# Patient Record
Sex: Male | Born: 1985 | Race: White | Hispanic: No | Smoking: Current every day smoker
Health system: Southern US, Community
[De-identification: ages and names within clinical notes are randomized; demographics above are authoritative.]

## PROBLEM LIST (undated history)

## (undated) DIAGNOSIS — F319 Bipolar disorder, unspecified: Secondary | ICD-10-CM

## (undated) DIAGNOSIS — F419 Anxiety disorder, unspecified: Secondary | ICD-10-CM

## (undated) DIAGNOSIS — S069X9A Unspecified intracranial injury with loss of consciousness of unspecified duration, initial encounter: Secondary | ICD-10-CM

## (undated) DIAGNOSIS — S069XAA Unspecified intracranial injury with loss of consciousness status unknown, initial encounter: Secondary | ICD-10-CM

## (undated) DIAGNOSIS — K5792 Diverticulitis of intestine, part unspecified, without perforation or abscess without bleeding: Secondary | ICD-10-CM

## (undated) HISTORY — PX: FRACTURE SURGERY: SHX138

## (undated) HISTORY — PX: ANKLE SURGERY: SHX546

---
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
## 2008-02-10 ENCOUNTER — Emergency Department (HOSPITAL_COMMUNITY): Admission: EM | Admit: 2008-02-10 | Discharge: 2008-02-10 | Payer: Self-pay | Admitting: Emergency Medicine

## 2008-08-09 ENCOUNTER — Emergency Department (HOSPITAL_COMMUNITY): Admission: EM | Admit: 2008-08-09 | Discharge: 2008-08-09 | Payer: Self-pay | Admitting: Emergency Medicine

## 2009-10-18 ENCOUNTER — Emergency Department (HOSPITAL_COMMUNITY): Admission: EM | Admit: 2009-10-18 | Discharge: 2009-10-20 | Payer: Self-pay | Admitting: Emergency Medicine

## 2010-05-24 LAB — DIFFERENTIAL
Basophils Absolute: 0 10*3/uL (ref 0.0–0.1)
Basophils Relative: 1 % (ref 0–1)
Eosinophils Relative: 1 % (ref 0–5)
Lymphocytes Relative: 25 % (ref 12–46)
Monocytes Absolute: 1 10*3/uL (ref 0.1–1.0)
Monocytes Relative: 18 % — ABNORMAL HIGH (ref 3–12)
Neutro Abs: 3.3 10*3/uL (ref 1.7–7.7)

## 2010-05-24 LAB — CBC
HCT: 42.4 % (ref 39.0–52.0)
Hemoglobin: 14.4 g/dL (ref 13.0–17.0)
MCHC: 34 g/dL (ref 30.0–36.0)
MCV: 92.5 fL (ref 78.0–100.0)
RDW: 14.4 % (ref 11.5–15.5)

## 2010-05-24 LAB — RAPID URINE DRUG SCREEN, HOSP PERFORMED
Amphetamines: NOT DETECTED
Cocaine: NOT DETECTED
Opiates: NOT DETECTED
Tetrahydrocannabinol: NOT DETECTED

## 2010-05-24 LAB — BASIC METABOLIC PANEL
BUN: 16 mg/dL (ref 6–23)
CO2: 29 mEq/L (ref 19–32)
Calcium: 9 mg/dL (ref 8.4–10.5)
Chloride: 105 mEq/L (ref 96–112)
Creatinine, Ser: 0.94 mg/dL (ref 0.4–1.5)
GFR calc Af Amer: >60 mL/min (ref >60)
GFR calc non Af Amer: >60 mL/min (ref >60)
Glucose, Bld: 99 mg/dL (ref 70–99)
Potassium: 3.5 mEq/L (ref 3.5–5.1)
Sodium: 141 mEq/L (ref 135–145)

## 2010-05-24 LAB — ETHANOL: Alcohol, Ethyl (B): <5 mg/dL (ref 0–10)

## 2010-06-17 LAB — URINALYSIS, ROUTINE W REFLEX MICROSCOPIC
Bilirubin Urine: NEGATIVE
Ketones, ur: NEGATIVE mg/dL
Nitrite: NEGATIVE
Urobilinogen, UA: 0.2 mg/dL (ref 0.0–1.0)
pH: 6 (ref 5.0–8.0)

## 2010-06-17 LAB — ETHANOL: Alcohol, Ethyl (B): <5 mg/dL (ref 0–10)

## 2010-06-17 LAB — BASIC METABOLIC PANEL
Chloride: 103 mEq/L (ref 96–112)
GFR calc Af Amer: >60 mL/min (ref >60)
GFR calc non Af Amer: >60 mL/min (ref >60)
Potassium: 3.4 mEq/L — ABNORMAL LOW (ref 3.5–5.1)
Sodium: 139 mEq/L (ref 135–145)

## 2010-06-17 LAB — DIFFERENTIAL
Eosinophils Relative: 0 % (ref 0–5)
Lymphocytes Relative: 15 % (ref 12–46)
Lymphs Abs: 1.1 10*3/uL (ref 0.7–4.0)
Monocytes Absolute: 0.9 10*3/uL (ref 0.1–1.0)
Monocytes Relative: 13 % — ABNORMAL HIGH (ref 3–12)

## 2010-06-17 LAB — RAPID URINE DRUG SCREEN, HOSP PERFORMED: Tetrahydrocannabinol: POSITIVE — AB

## 2010-06-17 LAB — CBC
HCT: 47 % (ref 39.0–52.0)
Hemoglobin: 16.6 g/dL (ref 13.0–17.0)
MCV: 93 fL (ref 78.0–100.0)
Platelets: 222 10*3/uL (ref 150–400)
RBC: 5.05 MIL/uL (ref 4.22–5.81)
WBC: 7.1 10*3/uL (ref 4.0–10.5)

## 2010-07-26 NOTE — H&P (Signed)
[name], [name]            ACCOUNT NO.:  [id number]   MEDICAL RECORD NO.:  [id number]          PATIENT TYPE:  AMB   LOCATION:  DAY                           FACILITY:  [facility]   PHYSICIAN:  Vickki Hearing, M.D.DATE OF BIRTH:  Jun 11, 1985   DATE OF ADMISSION:  DATE OF DISCHARGE:  LH                              HISTORY & PHYSICAL   CHIEF COMPLAINT:  Pain, left ankle.   HISTORY:  This is an 25 year old male who is in college at [employer] in [place].  He was injured on April 26, 2006,  falling, injuring his left ankle, sustained a left fibular fracture.  Initial treatment in the emergency room at [facility], splint was  applied.  Radiographs show a fibular fracture.  He was put on crutches.  He continued to go to school, so his foot is significantly swollen.  We  have given him instructions to go home, ice and elevate the foot and  have surgery when the foot swelling is resolved.   REVIEW OF SYSTEMS:  He is a healthy male, complains of some poor vision,  joint pain related to the injury.  All eight systems reviewed, otherwise  normal.   PAST HISTORY:  No allergies.   No medical problems.   No surgery.   No medications.   FAMILY HISTORY:  Positive for cancer and heart disease.   SOCIAL HISTORY:  He is a single, Physicist, medical.  Smokes a pack a  week.  Drinks a case of beer a week.  He is in his first year of  college.   PHYSICAL EXAMINATION:  Weight 180, pulse 86, respiratory rate 18.  Appearance normal.  He is tall and muscular with ectomorphic body  habitus.  PERIPHERAL VASCULAR SYSTEM:  Excellent pulses and perfusion.  Swelling  related to the left ankle fracture.  Gait is remarkable for non-weightbearing, crutch ambulation.  Foot swelling again is noted.  There is bruising and ecchymosis around  the circumference of the ankle. His Achilles tendon is palpably intact.  He has painful dorsiflexion of the foot and holds it in plantar  flexion.  Stability testing could not be performed.  The remaining extremities are aligned properly with no contracture.  No  subluxation, atrophy or tremor.  SKIN:  Normal everywhere else except the left ankle; again ecchymosis,  no blisters.  NEUROLOGICAL:  Tests showed normal coordination and reflexes.  Sensation  was normal in the foot, ankle and leg.  He was oriented x3.  Compartments were soft.  His mood and affect were normal.   A repeat mortise view was taken in the office due to the poor quality of  the initial film done in the ER.  It shows medial clear space widening  and displacement of the fibular fracture.  This will require surgical  treatment of the left ankle with plate and screws.   Pending evaluation on April 30, 2006, the patient will undergo open  treatment and internal fixation of the left ankle for left ankle fibular  fracture.   Informed consent process completed.  Risks and benefits explained.  All  questions answered.      Vickki Hearing, M.D.  Electronically Signed     SEH/MEDQ  D:  04/30/2006  T:  04/30/2006  Job:  [id number]   cc:   [facility]  Fax: [phone]

## 2010-07-26 NOTE — Discharge Summary (Signed)
NAMEBARRETT, GOLDIE            ACCOUNT NO.:  [id number]   MEDICAL RECORD NO.:  [id number]          PATIENT TYPE:  OBV   LOCATION:  A321                          FACILITY:  [facility]   PHYSICIAN:  Vickki Hearing, M.D.DATE OF BIRTH:  25-Oct-1985   DATE OF ADMISSION:  05/01/2006  DATE OF DISCHARGE:  02/23/2008LH                               DISCHARGE SUMMARY   Admitted for internal fixation of the left ankle after surgery.  He had  pain uncontrolled after 150 mcg of Fentanyl and 800 mg of Motrin, 500 mg  of Tylenol, Percocet, and morphine as well as 60 mg of Toradol.   Therefore, he was admitted for observation.   HOSPITAL COURSE:  He was put on a PCA pump, oral Percocet, and his pain  decreased to a level of 2 by May 02, 2006.  He was able to ambulate  with crutches nonweightbearing.  He caught up with physical therapy and  tolerated that well.   Foot had minimal swelling and vital signs were stable.   DISCHARGE MEDICATIONS:  1. Phenergan 25 mg p.o. q.6 hours p.r.n. for nausea.  2. Percocet 5 mg p.o. q.4 hours p.r.n. for pain, enough to last 2      weeks.  3. Ibuprofen 800 mg q.8 hours over-the-counter.   He is to follow up with me on Monday.  He is to continue  nonweightbearing and keep his foot iced and elevated.      Vickki Hearing, M.D.  Electronically Signed     SEH/MEDQ  D:  05/02/2006  T:  05/02/2006  Job:  [id number]

## 2010-07-26 NOTE — Op Note (Signed)
[name], [name]            ACCOUNT NO.:  [id number]   MEDICAL RECORD NO.:  [id number]          PATIENT TYPE:  AMB   LOCATION:  DAY                           FACILITY:  [facility]   PHYSICIAN:  Vickki Hearing, M.D.DATE OF BIRTH:  1985/11/27   DATE OF PROCEDURE:  05/01/2006  DATE OF DISCHARGE:                               OPERATIVE REPORT   This is an 25 year old male who is in collage at [employer] in  [place].  He was injured on April 26, 2006 secondary to a fall.  He sustained a left fibular fracture.  He was treated at [facility]  emergency room.  A splint was applied.  Radiographs showed a fibular  fracture.  He was put on crutches.  He continued to go to school.  He  came to the office with a significantly swollen foot, preempting  surgical treatment at that time.  He was given instructions to go home,  ice, elevate and a compression wrap was made.  He came back in two days  later for recheck of his foot.  His skin wrinkled, and we prepared him  for surgery for today.   PREOPERATIVE DIAGNOSIS:  Weber B left fibula fracture with medial  deltoid ligament sprain.   POSTOPERATIVE DIAGNOSIS:  Weber B left fibula fracture with medial  deltoid ligament sprain.   PROCEDURE:  Open treatment and internal fixation of the left fibula with  interfragmentary screw fixation and buttress plating from the [employer]  small fragment set.   SURGEON:  Vickki Hearing, M.D.   ASSISTANT:  None.   ANESTHESIA:  General by LMA.   OPERATIVE FINDINGS:  The fibular fracture was oblique with a long  posterior spike.  The ligaments between the fibula and the tibia were  intact.  After fixation, external rotation stress showed no syndesmotic  injury.   DESCRIPTION OF PROCEDURE:  This 25 year old male marked his left ankle  as the surgical site, and I countersigned it.  His history and physical  was updated, and he was taken to the operating room for general  anesthetic.   Antibiotics were then started.  At that point his left foot  and ankle were prepped and draped using DuraPrep and sterile technique.  A timeout procedure was then taken.  The surgical site was confirmed.  Antibiotics were confirmed.  The foot was elevated and exsanguinated  with a 4-inch Esmarch.  The tourniquet was elevated to 300 mmHg where it  stayed for 58 minutes.   A straight incision was made over the fibula and taken down to bone  distally and bluntly dissected down to bone proximally.  The fracture  site was irrigated, curetted and manually reduced and held with bone  clamps.  Radiograph was obtained, showing intact and restored mortise.   We then took a 3-5 drill bit and drilled the proximal fragment followed  with a countersink golf tee 2-5 drill bit depth gauge and put an  interfragmentary screw which gave excellent compression and reduction of  the fracture site.  We then applied a lateral seven-hole buttress plate  using six of the  seven holes using AO technique.  Each hole was drilled,  measured and screw was applied.  All screws were tightened at the end of  the procedure.  AP mortise and lateral x-rays showed confirmation of the  reduction.  At that point the syndesmosis was checked by external  rotation and manual traction with a dental pick.   Wound was irrigated and closed with 2-0 Monocryl and a combination of 3-  0 nylon and staples.  The tourniquet was released and a  short leg cast  was applied over sterile dressings.  The foot was left in neutral  position in terms of its relative position to the leg.   The patient was extubated and taken to the recovery room in stable  condition.   Plan is for nonweightbearing for 6 weeks and then weightbearing in a  cast for some time between 4-6 weeks.  We may use a articulated ankle  brace at the 6-week point.      Vickki Hearing, M.D.  Electronically Signed     SEH/MEDQ  D:  05/01/2006  T:  05/01/2006  Job:   [id number]

## 2011-02-08 ENCOUNTER — Encounter: Payer: Self-pay | Admitting: *Deleted

## 2011-02-08 ENCOUNTER — Emergency Department (HOSPITAL_COMMUNITY)
Admission: EM | Admit: 2011-02-08 | Discharge: 2011-02-08 | Disposition: A | Discharge status: Home / Self Care | Payer: BC Managed Care – PPO | Attending: Emergency Medicine | Admitting: Emergency Medicine

## 2011-02-08 ENCOUNTER — Emergency Department (HOSPITAL_COMMUNITY): Payer: BC Managed Care – PPO

## 2011-02-08 DIAGNOSIS — S20229A Contusion of unspecified back wall of thorax, initial encounter: Secondary | ICD-10-CM | POA: Insufficient documentation

## 2011-02-08 DIAGNOSIS — W108XXA Fall (on) (from) other stairs and steps, initial encounter: Secondary | ICD-10-CM | POA: Insufficient documentation

## 2011-02-08 DIAGNOSIS — F17200 Nicotine dependence, unspecified, uncomplicated: Secondary | ICD-10-CM | POA: Insufficient documentation

## 2011-02-08 DIAGNOSIS — F319 Bipolar disorder, unspecified: Secondary | ICD-10-CM | POA: Insufficient documentation

## 2011-02-08 HISTORY — DX: Bipolar disorder, unspecified: F31.9

## 2011-02-08 HISTORY — DX: Unspecified intracranial injury with loss of consciousness of unspecified duration, initial encounter: S06.9X9A

## 2011-02-08 HISTORY — DX: Unspecified intracranial injury with loss of consciousness status unknown, initial encounter: S06.9XAA

## 2011-02-08 MED ORDER — OXYCODONE-ACETAMINOPHEN 5-325 MG PO TABS: 1.0000 | ORAL_TABLET | ORAL | Status: AC | PRN

## 2011-02-08 MED ORDER — IBUPROFEN 600 MG PO TABS: 600.0000 mg | ORAL_TABLET | Freq: Four times a day (QID) | ORAL | Status: AC | PRN

## 2011-02-08 MED ORDER — OXYCODONE-ACETAMINOPHEN 5-325 MG PO TABS
2.0000 | ORAL_TABLET | Freq: Once | ORAL | Status: AC
  Administered 2011-02-08: 2 via ORAL
  Filled 2011-02-08: qty 2

## 2011-02-08 NOTE — ED Provider Notes (Signed)
Medical screening examination/treatment/procedure(s) were performed by non-physician practitioner and as supervising physician I was immediately available for consultation/collaboration.   Vida Roller, MD 02/08/11 [phone]

## 2011-02-08 NOTE — ED Notes (Signed)
Fall down 8 steps, slipped  With socks on . No LOC,  Pain in back , head,

## 2011-02-08 NOTE — ED Notes (Signed)
Alert talking, says he slipped due to wearing socks on wooden steps.  No LOC, color good, wife at bedside.  Pain low back and head, no visible trauma

## 2011-02-08 NOTE — ED Notes (Signed)
Patient transported to X-ray 

## 2011-02-08 NOTE — ED Provider Notes (Signed)
History     CSN: [id number] Arrival date & time: 02/08/2011 12:35 AM   First MD Initiated Contact with Patient 02/08/11 0046      Chief Complaint  Patient presents with  . Fall    (Consider location/radiation/quality/duration/timing/severity/associated sxs/prior treatment) Patient is a 25 y.o. male presenting with fall. The history is provided by the patient and the spouse.  Fall The accident occurred less than 1 hour ago. The fall occurred while walking (He slipped while walking down steps,  landing on his lower back across a step edge.  He also lightly hit his head when he fell. He slipped down 8 steps). Impact surface: Hardwood steps. There was no blood loss. Point of impact: lower back. Pain location: lower back. The pain is at a severity of 7/10. The pain is moderate. He was ambulatory at the scene. Pertinent negatives include no visual change, no fever, no numbness, no abdominal pain, no nausea, no vomiting, no headaches and no loss of consciousness. The symptoms are aggravated by flexion and pressure on the injury. He has tried nothing for the symptoms. The treatment provided no relief.    Past Medical History  Diagnosis Date  . Brain injury   . Bipolar 1 disorder     Past Surgical History  Procedure Date  . Fracture surgery     History reviewed. No pertinent family history.  History  Substance Use Topics  . Smoking status: Current Everyday Smoker    Types: Cigarettes  . Smokeless tobacco: Not on file  . Alcohol Use: No      Review of Systems  Constitutional: Negative for fever.  HENT: Negative for congestion, sore throat and neck pain.   Eyes: Negative.   Respiratory: Negative for chest tightness and shortness of breath.   Cardiovascular: Negative for chest pain.  Gastrointestinal: Negative for nausea, vomiting and abdominal pain.  Genitourinary: Negative.   Musculoskeletal: Positive for back pain. Negative for joint swelling and arthralgias.  Skin:  Negative.  Negative for rash and wound.  Neurological: Negative for dizziness, loss of consciousness, weakness, light-headedness, numbness and headaches.  Hematological: Negative.   Psychiatric/Behavioral: Negative.     Allergies  Review of patient's allergies indicates no known allergies.  Home Medications   Current Outpatient Rx  Name Route Sig Dispense Refill  . CARBAMAZEPINE 100 MG PO CHEW Oral Chew by mouth 2 (two) times daily.      Marland Kitchen CITALOPRAM HYDROBROMIDE 20 MG PO TABS Oral Take 20 mg by mouth daily.      Marland Kitchen RISPERIDONE 2 MG PO TABS Oral Take 2 mg by mouth 2 (two) times daily.      . IBUPROFEN 600 MG PO TABS Oral Take 1 tablet (600 mg total) by mouth every 6 (six) hours as needed for pain. 30 tablet 0  . OXYCODONE-ACETAMINOPHEN 5-325 MG PO TABS Oral Take 1 tablet by mouth every 4 (four) hours as needed for pain. 15 tablet 0    BP 108/72  Pulse 98  Temp(Src) 98 F (36.7 C) (Oral)  Resp 16  Ht 6\' 5"  (1.956 m)  Wt 250 lb (113.399 kg)  BMI 29.65 kg/m2  SpO2 99%  Physical Exam  Nursing note and vitals reviewed. Constitutional: He is oriented to person, place, and time. He appears well-developed and well-nourished.  HENT:  Head: Normocephalic.  Eyes: Conjunctivae are normal.  Neck: Normal range of motion. Neck supple.  Cardiovascular: Regular rhythm and intact distal pulses.        Pedal pulses normal.  Pulmonary/Chest: Effort normal. He has no wheezes.  Abdominal: Soft. Bowel sounds are normal. He exhibits no distension and no mass.  Musculoskeletal: Normal range of motion. He exhibits tenderness. He exhibits no edema.       Lumbar back: He exhibits bony tenderness and pain. He exhibits no swelling, no edema and no spasm.  Neurological: He is alert and oriented to person, place, and time. He has normal strength. He displays no atrophy and no tremor. No cranial nerve deficit or sensory deficit. Gait normal.  Reflex Scores:      Patellar reflexes are 2+ on the right side  and 2+ on the left side.      Achilles reflexes are 2+ on the right side and 2+ on the left side.      No strength deficit noted in hip and knee flexor and extensor muscle groups.  Ankle flexion and extension intact.  Skin: Skin is warm and dry.  Psychiatric: He has a normal mood and affect.    ED Course  Procedures (including critical care time)  Labs Reviewed - No data to display Dg Lumbar Spine Complete  02/08/2011  *RADIOLOGY REPORT*  Clinical Data: Status post fall down eight steps; lower back pain.  LUMBAR SPINE - COMPLETE 4+ VIEW  Comparison: None.  Findings: There is no evidence of fracture or subluxation. Vertebral bodies demonstrate normal height and alignment. Intervertebral disc spaces are preserved.  The visualized neural foramina are grossly unremarkable in appearance.  The visualized bowel gas pattern is unremarkable in appearance; air and stool are noted within the colon.  The sacroiliac joints are within normal limits.  IMPRESSION: No evidence of fracture or subluxation along the lumbar spine.  Original Report Authenticated By: Tonia Ghent, M.D.     1. Contusion of back       MDM  Lumbar contusion with no focal neuro deficits.  Oxycodone prn,  Ibuprofen 600 mg tid.  Ice,  Heat.  Rest.        Candis Musa, PA 02/08/11 [phone]

## 2011-03-19 ENCOUNTER — Emergency Department (HOSPITAL_COMMUNITY)
Admission: EM | Admit: 2011-03-19 | Discharge: 2011-03-20 | Disposition: A | Discharge status: Other Acute Inpatient Hospital | Payer: Medicaid - Out of State | Source: Home / Self Care | Attending: Emergency Medicine | Admitting: Emergency Medicine

## 2011-03-19 ENCOUNTER — Encounter (HOSPITAL_COMMUNITY): Payer: Self-pay | Admitting: *Deleted

## 2011-03-19 DIAGNOSIS — T43502A Poisoning by unspecified antipsychotics and neuroleptics, intentional self-harm, initial encounter: Secondary | ICD-10-CM | POA: Insufficient documentation

## 2011-03-19 DIAGNOSIS — T438X2A Poisoning by other psychotropic drugs, intentional self-harm, initial encounter: Secondary | ICD-10-CM | POA: Insufficient documentation

## 2011-03-19 DIAGNOSIS — F319 Bipolar disorder, unspecified: Secondary | ICD-10-CM | POA: Insufficient documentation

## 2011-03-19 DIAGNOSIS — T424X4A Poisoning by benzodiazepines, undetermined, initial encounter: Secondary | ICD-10-CM | POA: Insufficient documentation

## 2011-03-19 DIAGNOSIS — F17200 Nicotine dependence, unspecified, uncomplicated: Secondary | ICD-10-CM | POA: Insufficient documentation

## 2011-03-19 DIAGNOSIS — R45851 Suicidal ideations: Secondary | ICD-10-CM | POA: Insufficient documentation

## 2011-03-19 LAB — CBC
Hemoglobin: 14.5 g/dL (ref 13.0–17.0)
MCHC: 34 g/dL (ref 30.0–36.0)
RDW: 13.3 % (ref 11.5–15.5)
WBC: 9.6 10*3/uL (ref 4.0–10.5)

## 2011-03-19 LAB — DIFFERENTIAL
Basophils Absolute: 0 10*3/uL (ref 0.0–0.1)
Basophils Relative: 0 % (ref 0–1)
Neutro Abs: 6.2 10*3/uL (ref 1.7–7.7)
Neutrophils Relative %: 65 % (ref 43–77)

## 2011-03-19 NOTE — ED Notes (Signed)
Pt reports he took approx 20 klonopin this am, tonight pt did take home meds, risperidal tegetrol and celexa, pt did report to want to "stab himself" tonight

## 2011-03-20 ENCOUNTER — Inpatient Hospital Stay (HOSPITAL_COMMUNITY)
Admission: AD | Admit: 2011-03-20 | Discharge: 2011-03-24 | DRG: 885 | Disposition: A | Discharge status: Home / Self Care | Payer: Medicaid - Out of State | Source: Ambulatory Visit | Attending: Psychiatry | Admitting: Psychiatry

## 2011-03-20 ENCOUNTER — Encounter (HOSPITAL_COMMUNITY): Payer: Self-pay

## 2011-03-20 DIAGNOSIS — Z87820 Personal history of traumatic brain injury: Secondary | ICD-10-CM

## 2011-03-20 DIAGNOSIS — Z79899 Other long term (current) drug therapy: Secondary | ICD-10-CM

## 2011-03-20 DIAGNOSIS — F339 Major depressive disorder, recurrent, unspecified: Principal | ICD-10-CM | POA: Diagnosis present

## 2011-03-20 DIAGNOSIS — F4310 Post-traumatic stress disorder, unspecified: Secondary | ICD-10-CM

## 2011-03-20 DIAGNOSIS — F319 Bipolar disorder, unspecified: Secondary | ICD-10-CM

## 2011-03-20 DIAGNOSIS — R45851 Suicidal ideations: Secondary | ICD-10-CM

## 2011-03-20 DIAGNOSIS — F6381 Intermittent explosive disorder: Secondary | ICD-10-CM | POA: Diagnosis present

## 2011-03-20 LAB — COMPREHENSIVE METABOLIC PANEL
ALT: 14 U/L (ref 0–53)
Alkaline Phosphatase: 99 U/L (ref 39–117)
BUN: 13 mg/dL (ref 6–23)
CO2: 29 mEq/L (ref 19–32)
Calcium: 9.8 mg/dL (ref 8.4–10.5)
GFR calc Af Amer: >90 mL/min (ref >90)
GFR calc non Af Amer: >90 mL/min (ref >90)
Glucose, Bld: 99 mg/dL (ref 70–99)
Sodium: 138 mEq/L (ref 135–145)

## 2011-03-20 LAB — ACETAMINOPHEN LEVEL: Acetaminophen (Tylenol), Serum: <15 ug/mL (ref 10–30)

## 2011-03-20 LAB — CARBAMAZEPINE LEVEL, TOTAL: Carbamazepine Lvl: 4.1 ug/mL (ref 4.0–12.0)

## 2011-03-20 MED ORDER — ACETAMINOPHEN 325 MG PO TABS: 650.0000 mg | ORAL_TABLET | ORAL | Status: DC | PRN

## 2011-03-20 MED ORDER — CITALOPRAM HYDROBROMIDE 20 MG PO TABS
20.0000 mg | ORAL_TABLET | Freq: Every day | ORAL | Status: DC
  Administered 2011-03-20: 20 mg via ORAL
  Filled 2011-03-20 (×4): qty 1

## 2011-03-20 MED ORDER — ONDANSETRON HCL 4 MG PO TABS: 4.0000 mg | ORAL_TABLET | Freq: Three times a day (TID) | ORAL | Status: DC | PRN

## 2011-03-20 MED ORDER — CARBAMAZEPINE 100 MG PO CHEW
200.0000 mg | CHEWABLE_TABLET | Freq: Two times a day (BID) | ORAL | Status: DC
  Administered 2011-03-20 (×2): 200 mg via ORAL
  Filled 2011-03-20 (×6): qty 2

## 2011-03-20 MED ORDER — CARBAMAZEPINE 100 MG PO CHEW
CHEWABLE_TABLET | ORAL | Status: AC
  Filled 2011-03-20: qty 2

## 2011-03-20 MED ORDER — NICOTINE 21 MG/24HR TD PT24
21.0000 mg | MEDICATED_PATCH | Freq: Once | TRANSDERMAL | Status: DC
  Administered 2011-03-20: 21 mg via TRANSDERMAL
  Filled 2011-03-20: qty 1

## 2011-03-20 MED ORDER — CLONAZEPAM 0.5 MG PO TABS: 0.5000 mg | ORAL_TABLET | Freq: Three times a day (TID) | ORAL | Status: DC | PRN

## 2011-03-20 MED ORDER — RISPERIDONE 1 MG PO TABS
2.0000 mg | ORAL_TABLET | Freq: Every day | ORAL | Status: DC
  Administered 2011-03-20: 2 mg via ORAL
  Filled 2011-03-20: qty 2
  Filled 2011-03-20 (×2): qty 1

## 2011-03-20 MED ORDER — ZOLPIDEM TARTRATE 5 MG PO TABS: 5.0000 mg | ORAL_TABLET | Freq: Every evening | ORAL | Status: DC | PRN

## 2011-03-20 MED ORDER — RISPERIDONE 1 MG PO TABS
ORAL_TABLET | ORAL | Status: AC
  Filled 2011-03-20: qty 2

## 2011-03-20 MED ORDER — CITALOPRAM HYDROBROMIDE 20 MG PO TABS
ORAL_TABLET | ORAL | Status: AC
  Filled 2011-03-20: qty 1

## 2011-03-20 MED ORDER — TRAZODONE HCL 50 MG PO TABS
50.0000 mg | ORAL_TABLET | Freq: Every day | ORAL | Status: DC
  Filled 2011-03-20 (×3): qty 1

## 2011-03-20 NOTE — ED Notes (Signed)
Still awaiting meds from pharmacy. Pt sitting in chair in room with family members. Pt calm and cooperative.

## 2011-03-20 NOTE — ED Notes (Signed)
Sleeping. Family at bedside. 

## 2011-03-20 NOTE — ED Notes (Signed)
Pt refusing trazadone at present.

## 2011-03-20 NOTE — ED Notes (Signed)
Pt denies suicidal thoughts at this time. Pt notified that he is waiting for placement. Family at bedside.

## 2011-03-20 NOTE — ED Notes (Signed)
Pt requests hospital bed to sleep in. AC notified and will try and find a bed. Family at bedside. No complaints at present.

## 2011-03-20 NOTE — ED Notes (Signed)
Family at bedside. 

## 2011-03-20 NOTE — Progress Notes (Signed)
03/20/11---  1845  Saw Pt in the ER to sign support paperwork. Pt accepted to [facility] by Josiah Lobo to Dr. Dan Humphreys Room [phone].  Dr Bebe Shaggy agrees with disposition.

## 2011-03-20 NOTE — ED Notes (Signed)
[facility] called for transport. 

## 2011-03-20 NOTE — ED Notes (Signed)
Pt belongings given to EMS. Report given to Almyra Deforest, RN.

## 2011-03-20 NOTE — Progress Notes (Signed)
1444 Patient with suicidal ideation. Seen/evaluated by [facility]. IVC paperwork completed. Awaiting placement.

## 2011-03-20 NOTE — ED Provider Notes (Signed)
History     CSN: [id number]  Arrival date & time 03/19/11  2257   First MD Initiated Contact with Patient 03/20/11 0021      Chief Complaint  Patient presents with  . Suicidal     The history is provided by the patient and the spouse.   the patient reports taking approximately 20 Klonopin this morning that are 0.5 milligram tabs in an attempt to kill himself.  He denies use of alcohol or drugs.  He denies any other ingestion.  He reports a long-standing history of bipolar and depression with prior hospitalizations at psychiatric facilities for suicidal thoughts and attempts.  He's been compliant with his medications which his wife confirms that she helps him with his medications.  His wife states however that he did not take her Klonopin this morning and instead took them approximately 3-4 hours prior to arrival.  Nothing worsens the symptoms.  Nothing improves his symptoms.  He reports he feels slightly sleepy at this time.  He is otherwise without complaints.   Past Medical History  Diagnosis Date  . Brain injury   . Bipolar 1 disorder     Past Surgical History  Procedure Date  . Fracture surgery   . Ankle surgery     No family history on file.  History  Substance Use Topics  . Smoking status: Current Everyday Smoker    Types: Cigarettes  . Smokeless tobacco: Not on file  . Alcohol Use: No      Review of Systems  All other systems reviewed and are negative.    Allergies  Review of patient's allergies indicates no known allergies.  Home Medications   Current Outpatient Rx  Name Route Sig Dispense Refill  . CARBAMAZEPINE 100 MG PO CHEW Oral Chew 200 mg by mouth 2 (two) times daily.     Marland Kitchen CITALOPRAM HYDROBROMIDE 20 MG PO TABS Oral Take 20 mg by mouth daily.      Marland Kitchen CLONAZEPAM 0.5 MG PO TABS Oral Take 0.5 mg by mouth 3 (three) times daily as needed.    Marland Kitchen RISPERIDONE 2 MG PO TABS Oral Take 2 mg by mouth at bedtime.     . TRAZODONE HCL 50 MG PO TABS Oral Take 50 mg  by mouth at bedtime.      BP 96/48  Pulse 87  Temp(Src) 97.5 F (36.4 C) (Oral)  Resp 20  Ht 6\' 5"  (1.956 m)  Wt 250 lb (113.399 kg)  BMI 29.65 kg/m2  SpO2 93%  Physical Exam  Nursing note and vitals reviewed. Constitutional: He is oriented to person, place, and time. He appears well-developed and well-nourished.  HENT:  Head: Normocephalic and atraumatic.  Eyes: EOM are normal.  Neck: Normal range of motion.  Cardiovascular: Normal rate, regular rhythm, normal heart sounds and intact distal pulses.   Pulmonary/Chest: Effort normal and breath sounds normal. No respiratory distress.  Abdominal: Soft. He exhibits no distension. There is no tenderness.  Musculoskeletal: Normal range of motion.  Neurological: He is alert and oriented to person, place, and time.  Skin: Skin is warm and dry.  Psychiatric:       Flat affect.  Suicidal ideation.     ED Course  Procedures (including critical care time)  Labs Reviewed  COMPREHENSIVE METABOLIC PANEL - Abnormal; Notable for the following:    Potassium 3.4 (*)    Total Bilirubin 0.2 (*)    All other components within normal limits  DIFFERENTIAL - Abnormal; Notable for the  following:    Monocytes Absolute 1.1 (*)    All other components within normal limits  CBC  ETHANOL  ACETAMINOPHEN LEVEL  CARBAMAZEPINE LEVEL, TOTAL  URINE RAPID DRUG SCREEN (HOSP PERFORMED)   No results found.   1. Suicidal behavior       MDM  Patient reports taking his Klonopin tonight in an attempt to kill himself.  He reports increasing depression suicidal thoughts for several days.  He initially told me to come in the morning however his wife states that he took a handful in front of her proximal 3 hours prior to arrival.  7:15 AM [facility] to evaluate in the ER      Lyanne Co, MD 03/20/11 [phone]

## 2011-03-20 NOTE — ED Notes (Signed)
Sitting at bedside playing cards with his family. Pt notified that we are waiting on bed placement.

## 2011-03-20 NOTE — ED Notes (Signed)
Pt sitting talking with family. Calm and cooperative at this time. Awaiting placement.

## 2011-03-20 NOTE — BH Assessment (Signed)
Assessment Note   [name] is an 26 y.o. male.  The patient has a long history of psychiatric admissions and treatment. He is currently followed by  Dr. Theophilus Bones in [place]. Last night the patient took 15-20 Clonopin in front of his wife. He does not give a reason other than depression for wanting to die. He readily admits that his intention was to die from the overdose.  He has had an admission to [facility] and to [facility] in  The past. He is on probation for felony elude of a Emergency planning/management officer. He says he was in a manic state and was trying to get to [facility]. He says this will end in May 2013.   Patient does not want to go back to [facility], he says he was afraid there. He wants to go to [facility], even if he has to pay  As a private patient.  Axis I: Bipolar, Depressed Axis II: Deferred Axis III:  Past Medical History  Diagnosis Date  . Brain injury   . Bipolar 1 disorder    Axis IV: other psychosocial or environmental problems Axis V: 11-20 some danger of hurting self or others possible OR occasionally fails to maintain minimal personal hygiene OR gross impairment in communication  Past Medical History:  Past Medical History  Diagnosis Date  . Brain injury   . Bipolar 1 disorder     Past Surgical History  Procedure Date  . Fracture surgery   . Ankle surgery     Family History: No family history on file.  Social History:  reports that he has been smoking Cigarettes.  He does not have any smokeless tobacco history on file. He reports that he does not drink alcohol or use illicit drugs.  Additional Social History:    Allergies: No Known Allergies  Home Medications:  Medications Prior to Admission  Medication Dose Route Frequency Provider Last Rate Last Dose  . acetaminophen (TYLENOL) tablet 650 mg  650 mg Oral Q4H PRN Lyanne Co, MD      . carbamazepine (TEGRETOL) chewable tablet 200 mg  200 mg Oral BID Lyanne Co, MD      . citalopram (CELEXA) tablet 20 mg  20 mg Oral  Daily Lyanne Co, MD      . clonazePAM [facility]) tablet 0.5 mg  0.5 mg Oral TID PRN Lyanne Co, MD      . nicotine (NICODERM CQ - dosed in mg/24 hours) patch 21 mg  21 mg Transdermal Once Lyanne Co, MD   21 mg at 03/20/11 0135  . ondansetron (ZOFRAN) tablet 4 mg  4 mg Oral Q8H PRN Lyanne Co, MD      . risperiDONE (RISPERDAL) tablet 2 mg  2 mg Oral QHS Lyanne Co, MD      . traZODone (DESYREL) tablet 50 mg  50 mg Oral QHS Lyanne Co, MD      . zolpidem [facility]) tablet 5 mg  5 mg Oral QHS PRN Lyanne Co, MD       Medications Prior to Admission  Medication Sig Dispense Refill  . carbamazepine (TEGRETOL) 100 MG chewable tablet Chew 200 mg by mouth 2 (two) times daily.       . citalopram (CELEXA) 20 MG tablet Take 20 mg by mouth daily.        . risperiDONE (RISPERDAL) 2 MG tablet Take 2 mg by mouth at bedtime.         OB/GYN  Status:  No LMP for male patient.  General Assessment Data Location of Assessment: [facility] ACT Assessment: Yes Living Arrangements: Spouse/significant other Can pt return to current living arrangement?: Yes Admission Status: Involuntary Is patient capable of signing voluntary admission?: No Transfer from: Acute Hospital Referral Source: MD  Education Status Is patient currently in school?: No  Risk to self Suicidal Ideation: Yes-Currently Present Suicidal Intent: Yes-Currently Present Is patient at risk for suicide?: Yes Suicidal Plan?: Yes-Currently Present Specify Current Suicidal Plan: patient took 15-20 Klonopine Access to Means: Yes Specify Access to Suicidal Means: used his prescription medication What has been your use of drugs/alcohol within the last 12 months?: denies Previous Attempts/Gestures: Yes How many times?: 3  Other Self Harm Risks: denies Triggers for Past Attempts: Unpredictable Intentional Self Injurious Behavior: None Family Suicide History: No Recent stressful life event(s): Financial  Problems Persecutory voices/beliefs?: No Depression: Yes Depression Symptoms: Tearfulness;Isolating;Loss of interest in usual pleasures;Feeling worthless/self pity Substance abuse history and/or treatment for substance abuse?: No Suicide prevention information given to non-admitted patients: Not applicable  Risk to Others Homicidal Ideation: No Thoughts of Harm to Others: No Current Homicidal Intent: No Current Homicidal Plan: No Access to Homicidal Means: No Identified Victim: na History of harm to others?: No Assessment of Violence: None Noted Violent Behavior Description: na Does patient have access to weapons?: No Criminal Charges Pending?: No Does patient have a court date: No  Psychosis Hallucinations: None noted Delusions: None noted  Mental Status Report Appear/Hygiene: Other (Comment) (casual) Eye Contact: Fair Motor Activity: Restlessness Speech: Logical/coherent Level of Consciousness: Alert Mood: Depressed Affect: Blunted Anxiety Level: None Thought Processes: Coherent;Relevant Judgement: Unimpaired Orientation: Person;Place;Time;Situation Obsessive Compulsive Thoughts/Behaviors: None  Cognitive Functioning Concentration: Normal Memory: Recent Intact;Remote Intact IQ: Average Insight: Poor Impulse Control: Poor Appetite: Fair Weight Loss: 0  Weight Gain: 0  Sleep: No Change Total Hours of Sleep: 6  Vegetative Symptoms: None  Prior Inpatient Therapy Prior Inpatient Therapy: Yes Prior Therapy Dates: 2010 Prior Therapy Facilty/Provider(s): [facility] Reason for Treatment: depression;SI  Prior Outpatient Therapy Prior Outpatient Therapy: Yes Prior Therapy Dates: 2012 Prior Therapy Facilty/Provider(s): Bella Kennedy MD/[place] [place] Reason for Treatment: depression/medications            Values / Beliefs Cultural Requests During Hospitalization: None Spiritual Requests During Hospitalization: None        Additional  Information 1:1 In Past 12 Months?: No CIRT Risk: No Elopement Risk: No Does patient have medical clearance?: Yes     Disposition: Patient was made IVC as he has a history of flight in the past. He is referred to [facility]. Spoke with admissions and verified that they accept [place]. Patient referred to [facility] at his request to be a private pay patient.  Dr Colon Branch is agreeable with this plan. Disposition Disposition of Patient: Inpatient treatment program;Referred to Patient referred to: Other (Comment) ([facility]; [facility])  On Site Evaluation by:   Reviewed with Physician:     Jake Shark [facility] 03/20/2011 9:21 AM

## 2011-03-20 NOTE — ED Notes (Signed)
Mothers phone number- [phone]. Request that we call her when patient gets a bed. Grandparents at bedside.

## 2011-03-20 NOTE — ED Notes (Signed)
Pt given toothbrush and toothpaste. Retreived from pt and placed in his bag for transfer.

## 2011-03-20 NOTE — ED Notes (Signed)
Pt transferred via EMS to [facility]. Report given.

## 2011-03-21 DIAGNOSIS — F6381 Intermittent explosive disorder: Secondary | ICD-10-CM

## 2011-03-21 DIAGNOSIS — F339 Major depressive disorder, recurrent, unspecified: Principal | ICD-10-CM

## 2011-03-21 MED ORDER — ALUM & MAG HYDROXIDE-SIMETH 200-200-20 MG/5ML PO SUSP: 30.0000 mL | ORAL | Status: DC | PRN

## 2011-03-21 MED ORDER — NICOTINE 21 MG/24HR TD PT24
21.0000 mg | MEDICATED_PATCH | Freq: Every day | TRANSDERMAL | Status: DC
  Administered 2011-03-21 – 2011-03-24 (×4): 21 mg via TRANSDERMAL
  Filled 2011-03-21 (×4): qty 1

## 2011-03-21 MED ORDER — CITALOPRAM HYDROBROMIDE 20 MG PO TABS
30.0000 mg | ORAL_TABLET | Freq: Every day | ORAL | Status: DC
  Administered 2011-03-21 – 2011-03-24 (×4): 30 mg via ORAL
  Filled 2011-03-21 (×6): qty 3

## 2011-03-21 MED ORDER — RISPERIDONE 2 MG PO TABS
2.0000 mg | ORAL_TABLET | Freq: Every day | ORAL | Status: DC
  Administered 2011-03-21 – 2011-03-23 (×3): 2 mg via ORAL
  Filled 2011-03-21 (×5): qty 1

## 2011-03-21 MED ORDER — TRAZODONE HCL 50 MG PO TABS
50.0000 mg | ORAL_TABLET | Freq: Every evening | ORAL | Status: DC | PRN
  Filled 2011-03-21: qty 14
  Filled 2011-03-21: qty 1

## 2011-03-21 MED ORDER — MAGNESIUM HYDROXIDE 400 MG/5ML PO SUSP: 30.0000 mL | Freq: Every day | ORAL | Status: DC | PRN

## 2011-03-21 MED ORDER — CARBAMAZEPINE 200 MG PO TABS
400.0000 mg | ORAL_TABLET | Freq: Every day | ORAL | Status: DC
  Administered 2011-03-21 – 2011-03-23 (×3): 400 mg via ORAL
  Filled 2011-03-21 (×4): qty 2

## 2011-03-21 MED ORDER — CARBAMAZEPINE 200 MG PO TABS
200.0000 mg | ORAL_TABLET | Freq: Every day | ORAL | Status: DC
  Administered 2011-03-22 – 2011-03-24 (×3): 200 mg via ORAL
  Filled 2011-03-21 (×4): qty 1

## 2011-03-21 NOTE — Progress Notes (Signed)
Pt rates depression at a 0 and hopelessness at a 0 . Pt is here to get medication adjustment and work on Designer, industrial/product. Pt attends groups and interacts well with peers and staff. Pt was offered support and encouragement. Pt denies SI/HI. Pt is receptive to treatment and safety is maintained on unit.

## 2011-03-21 NOTE — Progress Notes (Signed)
Recreation Therapy Notes  03/21/2011         Time: 21308      Group Topic/Focus: The focus of this group is on enhancing the patient's understanding of leisure, barriers to leisure, and the importance of engaging in positive leisure activities upon discharge for improved total health.  Participation Level: Active  Participation Quality: Appropriate and Attentive  Affect: Appropriate  Cognitive: Oriented   Additional Comments: Patient says he has learned ways to better control his anger, focused on becoming a father- says him and his wife are trying to have a baby.Marland Kitchen   Bethannie Iglehart 03/21/2011 3:55 PM
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
## 2011-03-21 NOTE — Progress Notes (Signed)
[facility] Group Notes: (Counselor/Nursing/MHT/Case Management/Adjunct) 03/21/2011   @  11:00am   Type of Therapy:  Group Therapy  Participation Level:  Minimal  Participation Quality:  Attentive  Affect:  Blunted  Cognitive:  Appropriate  Insight:  None  Engagement in Group: Minimal  Engagement in Therapy:  None  Modes of Intervention:  Support and Exploration  Summary of Progress/Problems: Jabri  was attentive but not engaged in group process    Billie Lade 03/21/2011 3:03 PM

## 2011-03-21 NOTE — Tx Team (Signed)
Initial Interdisciplinary Treatment Plan  PATIENT STRENGTHS: (choose at least two) Ability for insight Average or above average intelligence Financial means General fund of knowledge Motivation for treatment/growth Physical Health Supportive family/friends  PATIENT STRESSORS: Legal issue not being able to control his anger appropriately    PROBLEM LIST: Problem List/Patient Goals Date to be addressed Date deferred Reason deferred Estimated date of resolution  Increased risk for SI 03/20/11     Depression 03/20/11     Anger management as stated by pt 03/20/11                                          DISCHARGE CRITERIA:  Ability to meet basic life and health needs Improved stabilization in mood, thinking, and/or behavior Motivation to continue treatment in a less acute level of care Need for constant or close observation no longer present Verbal commitment to aftercare and medication compliance  PRELIMINARY DISCHARGE PLAN: Attend aftercare/continuing care group  PATIENT/FAMIILY INVOLVEMENT: This treatment plan has been presented to and reviewed with the patient, [name].  The patient and family have been given the opportunity to ask questions and make suggestions.  Talitha Givens, RN 03/21/2011, 5:19 AM

## 2011-03-21 NOTE — Progress Notes (Signed)
26 yr old male with a hx of Bipolar I disorder. Pt was IVC after OD on 20 klonopins. Pt reports feelings of depression and per report he also wanted to stab himself. Pt also reports that he has anger management issues, and wants assistance in controlling his anger appropriately. Pt was calm and cooperative and expressed the want for help. During PTA med review, pt states that he is compliant with his meds, but doesn't wish to continue to use Klonopin. Pt is currently denying SI. Pt has been orientated to the unit's rules and policies. Support and availability as needed has been extended to this pt. Pt refers to his wife as his primary support system. Pt safety remains with q62min checks.

## 2011-03-21 NOTE — Tx Team (Signed)
Interdisciplinary Treatment Plan Update (Adult)  Date:  03/21/2011  Time Reviewed:  10:43 AM   Progress in Treatment: Attending groups:   Yes   Participating in groups:  Yes Taking medication as prescribed:  Yes Tolerating medication:  Yes Family/Significant othe contact made: Patient to sign consent for contact with mother Patient understands diagnosis:  Yes Discussing patient identified problems/goals with staff: Yes- [name] stated he was not suicidal prior to admission but   Was advised to say he was suicidal to get into the hospital Medical problems stabilized or resolved: Yes Denies suicidal/homicidal ideation:Yes Issues/concerns per patient self-inventory: Anger Other:  New problem(s) identified:  Reason for Continuation of Hospitalization: Depression Anger Medication Management   Interventions implemented related to continuation of hospitalization:  Medication Management; safety checks q 15 mins  Additional comments: Suicide prevention reviewed  Estimated length of stay:  2-4 days  Discharge Plan: Home with family - Follow up with [facility]  New goal(s):  Review of initial/current patient goals per problem list:    1.  Goal(s):  Eliminate SI  Met:  Yes  Target date: d/c  As evidenced by:  Currently denying SI  2.  Goal (s):  Anger control  Met:  No  Target date: d/c  As evidenced by:  [name] will report anger in control  3.  Goal(s):  .stabilize on meds   Met:  No  Target date: d/c  As evidenced by:  Will report medication working - symptoms decreased  4.  Goal(s):  Outpatient follow up  Met:  No  Target date: d/c  As evidenced by: Follow up appointment will be in place  Attendees: Patient:  [name] 03/21/2011  11:14 AM   Family:     Physician:   03/21/2011 10:43 AM   Nursing:   Baird Cancer, RN 03/21/2011 10:43 AM   CaseManager:  Juline Patch, LCSW 03/21/2011 10:43 AM   Counselor:  Angus Palms, LCSW  03/21/2011 10:43 AM   Other:  Consuello Bossier, NP 03/21/2011 10:43 AM   Other:   03/21/2011  10:43 AM   Other:     Other:      Scribe for Treatment Team:   Wynn Banker, LCSW,  03/21/2011 10:43 AM
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
## 2011-03-21 NOTE — Progress Notes (Signed)
[facility] Group Notes: (Counselor/Nursing/MHT/Case Management/Adjunct) 03/21/2011   @1 :15pm  Type of Therapy:  Group Therapy  Participation Level:  Active  Participation Quality:  Attentive, Sharing  Affect:  Appropriate  Cognitive:  Appropriate  Insight:  Limited  Engagement in Group: Good  Engagement in Therapy:  Good  Modes of Intervention:  Support and Exploration  Summary of Progress/Problems: [name] participated in progressive muscle relaxation exercise and processed his experience. He stated that the exercise was helpful to him in relaxing.   Billie Lade 03/21/2011 3:32 PM

## 2011-03-22 LAB — DIFFERENTIAL
Basophils Relative: 1 % (ref 0–1)
Monocytes Absolute: 1 10*3/uL (ref 0.1–1.0)
Monocytes Relative: 13 % — ABNORMAL HIGH (ref 3–12)
Neutro Abs: 4 10*3/uL (ref 1.7–7.7)

## 2011-03-22 LAB — CBC
HCT: 45.6 % (ref 39.0–52.0)
Hemoglobin: 15.3 g/dL (ref 13.0–17.0)
MCHC: 33.6 g/dL (ref 30.0–36.0)
MCV: 89.4 fL (ref 78.0–100.0)

## 2011-03-22 LAB — COMPREHENSIVE METABOLIC PANEL
ALT: 17 U/L (ref 0–53)
AST: 20 U/L (ref 0–37)
BUN: 11 mg/dL (ref 6–23)
CO2: 26 mEq/L (ref 19–32)
Chloride: 101 mEq/L (ref 96–112)
Creatinine, Ser: 0.96 mg/dL (ref 0.50–1.35)
GFR calc Af Amer: >90 mL/min (ref >90)
Glucose, Bld: 92 mg/dL (ref 70–99)
Potassium: 4.3 mEq/L (ref 3.5–5.1)

## 2011-03-22 LAB — CARBAMAZEPINE LEVEL, TOTAL: Carbamazepine Lvl: 5.6 ug/mL (ref 4.0–12.0)

## 2011-03-22 NOTE — Progress Notes (Signed)
Patient ID: [name], male   DOB: 1985/03/12, 26 y.o.   MRN: [id number]   Interval Hx:  Reports getting better. Thinks he can control his anger better these days. Plan to continue with his anger issues as out pt after discharge. Sleep and appetite is ok. Attending groups right now. No side effects from meds. Plan to work after going home.   Mental Status Examination/Evaluation:  Objective: Appearance: Fairly Groomed   Eye Contact:: Good   Speech: Clear and Coherent   Volume: Normal   Mood: ok  Affect: Congruent   Thought Process: Logical   Orientation: Full   Thought Content: Hallucinations: Denies, admits hx.   Suicidal Thoughts: No   Homicidal Thoughts: No   Judgement: Intact   Insight: fair  Psychomotor Activity: Normal   Akathisia: No   Handed: Right   AIMS (if indicated):   Assets: Others: Ability to manage anger          Assessment:  AXIS I  Bipolar, mixed   AXIS II  Deferred   AXIS III  Past Medical History    Diagnosis  Date    .  Brain injury     .  Bipolar 1 disorder       AXIS IV  other psychosocial or environmental problems   AXIS V  35     Treatment Plan/Recommendations:    1. continue current meds

## 2011-03-22 NOTE — Progress Notes (Signed)
Pt has been calm and cooperative this patient. This pt is vested in his treatment. Pt speaks positive about his future plans and follow-up care. Pt reports no withdrawal symptoms from Klonopin and reports no adverse reactions from Tegretol. Continue support and availability as needed has been extended to this patient. Pt safety remains with q93min checks.
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
## 2011-03-22 NOTE — [facility] Counselor (Signed)
[facility] Group Notes:  (Counselor/Nursing/MHT/Case Management/Adjunct)  03/22/2011 12:53 PM  Pt. attedned after care planning group and was given the [place] SI information along with crisis and hot line numbers. Pt. Agreed to use them if needed.  The pt. Spoke about possibly being d/c on Monday. The pt. stated his time at [facility] has helped him.    [name] [place] 03/22/2011, 12:53 PM

## 2011-03-22 NOTE — Progress Notes (Signed)
[facility] Group Notes:  (Counselor/Nursing/MHT/Case Management/Adjunct)  03/22/2011 4:45 PM  Type of Therapy:  Group Therapy  Participation Level:  Active  Participation Quality:  Appropriate  Affect:  Appropriate  Cognitive:  Appropriate  Insight:  Good  Engagement in Group:  Good  Engagement in Therapy:  Good  Modes of Intervention:  Clarification and Support  Summary of Progress/Problems:Pt. Participated in group on self sabotaging behaviors and enabling. Each pt. Shared how they self sabotage and spoke about how they could positively enable themselves in order to make positive changes in their lives as well as have a successful recovery. The pt. Spoke about  Having anger issues and how he starts fights with his spouse as a self-sabotaging behavior. The pt. stated he has learned a lot about himself and in the counseling groups at [facility]. The pt. Spoke about doing positive and saying positive things about the changes he wants to make.   [name] [place] 03/22/2011, 4:45 PM

## 2011-03-22 NOTE — Progress Notes (Signed)
Pt attended the  Groups this morning. States that he is having issues with his anger, and was very interested to learn about anger in group. States that he and his wife are having problems due to both of their anger issues. They spend a lot of time fighting and being disrespectful to each other. Today called his wife to ask her if she was invested in their relationship and he was very pleased when she responded that she loved him and wanted very much to work on the relationship. Pt denies SI and HI.  Given support, reassurance and praise.

## 2011-03-23 LAB — T4, FREE: Free T4: 1 ng/dL (ref 0.80–1.80)

## 2011-03-23 LAB — T3, FREE: T3, Free: 3.1 pg/mL (ref 2.3–4.2)

## 2011-03-23 NOTE — Progress Notes (Signed)
Patient ID: GERMANY CHELF, male   DOB: 08/09/1985, 26 y.o.   MRN: [id number]   Interval Hx:  Was visited by family yesterday. Thinks he can control his anger better these days. Plan to continue working on his anger issues as out pt after discharge. Sleep and appetite is ok. Attending groups right now. No side effects from meds. Plan to work after going home. Reports getting better.    Mental Status Examination/Evaluation:  Objective: Appearance: Fairly Groomed   Eye Contact:: Good   Speech: Clear and Coherent   Volume: Normal   Mood: ok  Affect: Congruent   Thought Process: Logical   Orientation: Full   Thought Content: Hallucinations: Denies, admits hx.   Suicidal Thoughts: No   Homicidal Thoughts: No   Judgement: Intact   Insight: fair  Psychomotor Activity: Normal   Akathisia: No   Handed: Right   AIMS (if indicated):   Assets: Others: Ability to manage anger          Assessment:  AXIS I  Bipolar, mixed   AXIS II  Deferred   AXIS III  Past Medical History    Diagnosis  Date    .  Brain injury     .  Bipolar 1 disorder       AXIS IV  other psychosocial or environmental problems   AXIS V  35     Treatment Plan/Recommendations:    1. continue current meds

## 2011-03-23 NOTE — Progress Notes (Signed)
[facility] Group Notes:  (Counselor/Nursing/MHT/Case Management/Adjunct)  03/23/2011 6:24 PM  Type of Therapy:  Group Therapy  Participation Level:  Active  Participation Quality:  Appropriate  Affect:  Appropriate  Cognitive:  Appropriate  Insight:  Good  Engagement in Group:  Good  Engagement in Therapy:  Good  Modes of Intervention:  Clarification and Support  Summary of Progress/Problems: Pt. Participated in group discussion on supports that are healthy and that are unhealthy and each pt. Shared what support they had in their lives and how that person supports them. Each pt. Was encouraged by the therapist to find many healthy supports that they could in order to have a successful recovery. Pt. Spoke about his wife being a emotional support to him.  [name] 03/23/2011, 6:24 PM
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
## 2011-03-23 NOTE — Progress Notes (Signed)
Pt is up and around milieu interacting appropriately with peers and staff. He is somewhat anxious in affect and mood though states he is doing "great." "I am happy to be going home tomorrow but a little bit anxious." Support and encouragement given. He is pleasant and appropriate. Level II obs remain in place and pt is safe. Currently in group. [name]

## 2011-03-23 NOTE — Progress Notes (Signed)
[facility] Group Notes:  (Counselor/Nursing/MHT/Case Management/Adjunct)  03/23/2011 6:39 PM  Type of Therapy:  After Care group  Pt. Was given [name] information and crisis and hotline numbers. Pt. Agreed to use them if needed.   [name] [place] 03/23/2011, 6:39 PM 

## 2011-03-23 NOTE — Progress Notes (Signed)
[facility] Adult Inpatient Family/Significant Other Suicide Prevention Education  Suicide Prevention Education:  Education Completed; [name] [phone]) has been identified by the patient as the family member/significant other with whom the patient will be residing, and identified as the person(s) who will aid the patient in the event of a mental health crisis (suicidal ideations/suicide attempt).  With written consent from the patient, the family member/significant other has been provided the following suicide prevention education, prior to the and/or following the discharge of the patient.  The suicide prevention education provided includes the following:  Suicide risk factors  Suicide prevention and interventions  National Suicide Hotline telephone number  [facility] assessment telephone number  [facility] Emergency Assistance 911  [facility] and/or Residential Mobile Crisis Unit telephone number  Request made of family/significant other to:  Remove weapons (e.g., guns, rifles, knives), all items previously/currently identified as safety concern.  Pt. Wife sates there are no weapons  Remove drugs/medications (over-the-counter, prescriptions, illicit drugs), all items previously/currently identified as a safety concern. Pt.'s wife will secure home.  The family member/significant other verbalizes understanding of the suicide prevention education information provided.  The family member/significant other agrees to remove the items of safety concern listed above.  [name] [place] 03/23/2011, 4:15 PM

## 2011-03-24 MED ORDER — CITALOPRAM HYDROBROMIDE 10 MG PO TABS: 30.0000 mg | ORAL_TABLET | Freq: Every day | ORAL | Status: DC

## 2011-03-24 MED ORDER — CARBAMAZEPINE 200 MG PO TABS: ORAL_TABLET | ORAL | Status: DC

## 2011-03-24 MED ORDER — RISPERIDONE 2 MG PO TABS: 2.0000 mg | ORAL_TABLET | Freq: Every day | ORAL | Status: DC

## 2011-03-24 MED ORDER — TRAZODONE HCL 50 MG PO TABS: 50.0000 mg | ORAL_TABLET | Freq: Every evening | ORAL | Status: AC | PRN

## 2011-03-24 MED ORDER — TRAZODONE HCL 50 MG PO TABS: 50.0000 mg | ORAL_TABLET | Freq: Every evening | ORAL | Status: DC | PRN
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
## 2011-03-24 NOTE — Tx Team (Signed)
Interdisciplinary Treatment Plan Update (Adult)  Date:  03/24/2011  Time Reviewed:  10:13 AM   Progress in Treatment: Attending groups:   Yes   Participating in groups:  Yes Taking medication as prescribed:  Yes Tolerating medication:  Yes Family/Significant othe contact made: Contact made with family Patient understands diagnosis:  Yes Discussing patient identified problems/goals with staff: Yes Medical problems stabilized or resolved: Yes Denies suicidal/homicidal ideation: Yes - [name] is denying SI Issues/concerns per patient self-inventory: None identified Other:  New problem(s) identified:  Reason for Continuation of Hospitalization:   Interventions implemented related to continuation of hospitalization:   Additional comments:  Estimated length of stay: Discharge home today  Discharge Plan: Home with wife - follow up scheduled with [facility]  New goal(s):  Review of initial/current patient goals per problem list:   1.  Goal(s):  Eliminate SI  Met:  Yes  Target date:d/c  As evidenced by: [name] reports no SI  2.  Goal (s):  Reduce anger  Met:  Yes  Target date:  D/c  As evidenced by:  Denies anger - believes he has anger under control  3.  Goal(s):  Stabilize on meds  Met:  Yes  Target date: d/c  As evidenced by:  [name] reports medications are working  4.  Goal(s):  Schedule f/u  Met:  Yes  Target date:  d/c  As evidenced by:  Follow up scheduled with [facility] for Wednesday, January 16, 213  Attendees: Patient:  [name] 03/24/2011  10:44 AM   Family:     Physician: 03/24/2011 10:13 AM   Nursing:   Faythe Dingwall, RN 03/24/2011 10:13 AM   CaseManager:  Juline Patch, LCSW 03/24/2011 10:13 AM   Counselor:  Angus Palms, LCSW 03/24/2011 10:13 AM   Other:  Consuello Bossier, NP 03/24/2011 10:13 AM   Other:  Reyes Ivan, LCSWA 03/24/2011  10:13 AM   Other:     Other:      Scribe for Treatment Team:     Wynn Banker, LCSW,  03/24/2011 10:13 AM

## 2011-03-24 NOTE — Progress Notes (Addendum)
[facility] Case Management Discharge Plan:  Will you be returning to the same living situation after discharge: Yes,  Will return home with wife Would you like a referral for services when you are discharged:No. Do you have access to transportation at discharge:Yes,  Family will transport home Do you have the ability to pay for your medications:Yes,  Patient has Medicaid  Interagency Information:     Patient to Follow up at:  Follow-up Information    Follow up with Sharlotte Alamo, [facility]. Call on 03/26/2011. (Your appointment with Sharlotte Alamo at 1:00 on Wednesdy, March 26, 2011)    Contact information:   [address] [place], [place]  [address]  [phone]         Patient denies SI/HI:   denies    Safety Planning and Suicide Prevention discussed:  Yes,  Reviewed during discharge planning group  Barrier to discharge identified:No.  Summary and Recommendations:  Patient reports being ready to discharge home and put skills in place for managing anger.  [place] is requesting to be seen by provider on a more frequent basis.  Request made when follow up appointment scheduled.   Wynn Banker 03/24/2011, 10:11 AM

## 2011-03-24 NOTE — Progress Notes (Signed)
Discharge: from [facility] to home w/wife, sample meds given, scripts given, signed release of information, given AVS w/appointment etc. Patient is happy to be leaving and ready for the "challange"

## 2011-03-24 NOTE — Progress Notes (Signed)
Slept well last nite, appetite is good, energy level is normal, ability to pay attention is good, depressed 0/10 and hopeless 0/10, denies Si or Hi, to be discharged today per patient and he is excited/anxious about the discharge (in a  Good way), attending group and interacting w/staff/patients q48min safety checks continue and support offered Safety maintained

## 2011-03-24 NOTE — [facility] Suicide Risk Assessment (Signed)
Suicide Risk Assessment  Discharge Assessment     Demographic factors: See chart.  Current Mental Status: Patient seen and evaluated. Chart reviewed. Patient stated that his mood was "good". His affect was mood congruent and euthymic. He denied any current thoughts of self injurious behavior, suicidal ideation or homicidal ideation. He denied any significant depressive signs or symptoms at this time. There were no auditory or visual hallucinations, paranoia, delusional thought processes, or mania noted.  Thought process was linear and goal directed.  No psychomotor agitation or retardation was noted. His speech was normal rate, tone and volume. Eye contact was good. Judgment and insight are fair.  Patient has been up and engaged on the unit.  No acute safety concerns reported from team.  Pt said his "medication cocktail" was working well for him at this time.   Loss Factors: legal issues   Historical Factors: Prior suicide attempts   Risk Reduction Factors: Living with another person, especially a relative;Positive social support   CLINICAL FACTORS (noted upon admission):  Depression: Aggression  Impulsivity  More than one psychiatric diagnosis  Previous Psychiatric Diagnoses and Treatments   COGNITIVE FEATURES THAT CONTRIBUTE TO RISK: none noted.  Diagnoses:  Axis I: Mood Disorder NOS  Intermittent Explosive Disorder, per Hx   SUICIDE RISK: Pt viewed as a chronic increased risk of harm to self in light of his past hx and risk factors.  No acute safety concerns on the unit.  Pt contracting for safety and is stable for discharge.  PLAN: Pt stable for and requesting discharge. Pt contracting for safety and does not currently meet [place] involuntary commitment criteria for continued hospitalization.  Mental health treatment and medication management will mitigate against the increased risk of harm to self and/or others. Will continue current meds.  Pt agreeable with the plan.  Discussed with the  team.  Please see orders, follow up plans per team and full discharge summary to be completed by physician extender.   Mccann, Kevin Cranfield 03/24/2011, 1:40 PM

## 2011-03-26 NOTE — Progress Notes (Signed)
Patient Discharge Instructions:  Admission Note Faxed,  03/25/2011 After Visit Summary Faxed,  03/25/2011 Faxed to the [facility] provider:  03/25/2011 D/C Summary faxed 03/25/2011 Facesheet faxed 03/25/2011  Faxed to [facility] [employer] @ [phone]  Heloise Purpura, Eduard Clos, 03/26/2011, 12:14 PM

## 2012-03-16 ENCOUNTER — Inpatient Hospital Stay (HOSPITAL_COMMUNITY)
Admission: AD | Admit: 2012-03-16 | Discharge: 2012-03-19 | DRG: 885 | Disposition: A | Discharge status: Home / Self Care | Payer: Medicaid - Out of State | Source: Ambulatory Visit | Attending: Psychiatry | Admitting: Psychiatry

## 2012-03-16 ENCOUNTER — Encounter (HOSPITAL_COMMUNITY): Payer: Self-pay | Admitting: *Deleted

## 2012-03-16 ENCOUNTER — Emergency Department (HOSPITAL_COMMUNITY)
Admission: EM | Admit: 2012-03-16 | Discharge: 2012-03-16 | Disposition: A | Discharge status: Other Acute Inpatient Hospital | Payer: Medicaid - Out of State | Source: Home / Self Care | Attending: Emergency Medicine | Admitting: Emergency Medicine

## 2012-03-16 DIAGNOSIS — Z79899 Other long term (current) drug therapy: Secondary | ICD-10-CM | POA: Insufficient documentation

## 2012-03-16 DIAGNOSIS — Z87828 Personal history of other (healed) physical injury and trauma: Secondary | ICD-10-CM | POA: Insufficient documentation

## 2012-03-16 DIAGNOSIS — F1210 Cannabis abuse, uncomplicated: Secondary | ICD-10-CM | POA: Diagnosis present

## 2012-03-16 DIAGNOSIS — F319 Bipolar disorder, unspecified: Secondary | ICD-10-CM

## 2012-03-16 DIAGNOSIS — F310 Bipolar disorder, current episode hypomanic: Secondary | ICD-10-CM

## 2012-03-16 DIAGNOSIS — F3110 Bipolar disorder, current episode manic without psychotic features, unspecified: Principal | ICD-10-CM | POA: Diagnosis present

## 2012-03-16 DIAGNOSIS — F17200 Nicotine dependence, unspecified, uncomplicated: Secondary | ICD-10-CM | POA: Insufficient documentation

## 2012-03-16 LAB — BASIC METABOLIC PANEL
BUN: 8 mg/dL (ref 6–23)
CO2: 30 mEq/L (ref 19–32)
Chloride: 100 mEq/L (ref 96–112)
Glucose, Bld: 112 mg/dL — ABNORMAL HIGH (ref 70–99)
Potassium: 3 mEq/L — ABNORMAL LOW (ref 3.5–5.1)

## 2012-03-16 LAB — CBC WITH DIFFERENTIAL/PLATELET
Hemoglobin: 15.2 g/dL (ref 13.0–17.0)
Lymphs Abs: 2.2 10*3/uL (ref 0.7–4.0)
MCH: 31 pg (ref 26.0–34.0)
Monocytes Relative: 13 % — ABNORMAL HIGH (ref 3–12)
Neutro Abs: 2.9 10*3/uL (ref 1.7–7.7)
Neutrophils Relative %: 48 % (ref 43–77)
RBC: 4.9 MIL/uL (ref 4.22–5.81)

## 2012-03-16 LAB — RAPID URINE DRUG SCREEN, HOSP PERFORMED
Barbiturates: NOT DETECTED
Benzodiazepines: NOT DETECTED
Cocaine: NOT DETECTED
Tetrahydrocannabinol: POSITIVE — AB

## 2012-03-16 MED ORDER — CITALOPRAM HYDROBROMIDE 20 MG PO TABS
20.0000 mg | ORAL_TABLET | Freq: Every day | ORAL | Status: DC
Start: 2012-03-17 — End: 2012-03-19
  Administered 2012-03-17 – 2012-03-19 (×3): 20 mg via ORAL
  Filled 2012-03-16 (×6): qty 1

## 2012-03-16 MED ORDER — ALUM & MAG HYDROXIDE-SIMETH 200-200-20 MG/5ML PO SUSP: 30.0000 mL | ORAL | Status: DC | PRN

## 2012-03-16 MED ORDER — CARBAMAZEPINE 200 MG PO TABS
200.0000 mg | ORAL_TABLET | Freq: Every day | ORAL | Status: DC
  Administered 2012-03-16 – 2012-03-18 (×3): 200 mg via ORAL
  Filled 2012-03-16 (×6): qty 1

## 2012-03-16 MED ORDER — NICOTINE 21 MG/24HR TD PT24
21.0000 mg | MEDICATED_PATCH | Freq: Every day | TRANSDERMAL | Status: DC
  Administered 2012-03-16: 21 mg via TRANSDERMAL
  Filled 2012-03-16: qty 1

## 2012-03-16 MED ORDER — HYDROXYZINE HCL 25 MG PO TABS: 25.0000 mg | ORAL_TABLET | Freq: Four times a day (QID) | ORAL | Status: DC | PRN

## 2012-03-16 MED ORDER — CARBAMAZEPINE 200 MG PO TABS
400.0000 mg | ORAL_TABLET | Freq: Every day | ORAL | Status: DC
  Administered 2012-03-17 – 2012-03-19 (×3): 400 mg via ORAL
  Filled 2012-03-16 (×6): qty 2

## 2012-03-16 MED ORDER — POTASSIUM CHLORIDE CRYS ER 20 MEQ PO TBCR
40.0000 meq | EXTENDED_RELEASE_TABLET | Freq: Once | ORAL | Status: AC
  Administered 2012-03-16: 40 meq via ORAL
  Filled 2012-03-16: qty 2

## 2012-03-16 MED ORDER — ZOLPIDEM TARTRATE 5 MG PO TABS: 5.0000 mg | ORAL_TABLET | Freq: Every evening | ORAL | Status: DC | PRN

## 2012-03-16 MED ORDER — RISPERIDONE 2 MG PO TABS
2.0000 mg | ORAL_TABLET | Freq: Every day | ORAL | Status: DC
  Administered 2012-03-16 – 2012-03-18 (×3): 2 mg via ORAL
  Filled 2012-03-16 (×6): qty 1

## 2012-03-16 MED ORDER — IBUPROFEN 400 MG PO TABS: 600.0000 mg | ORAL_TABLET | Freq: Three times a day (TID) | ORAL | Status: DC | PRN

## 2012-03-16 MED ORDER — ACETAMINOPHEN 325 MG PO TABS: 650.0000 mg | ORAL_TABLET | ORAL | Status: DC | PRN

## 2012-03-16 MED ORDER — CARBAMAZEPINE 200 MG PO TABS: 200.0000 mg | ORAL_TABLET | Freq: Two times a day (BID) | ORAL | Status: DC

## 2012-03-16 MED ORDER — LORAZEPAM 1 MG PO TABS
1.0000 mg | ORAL_TABLET | Freq: Three times a day (TID) | ORAL | Status: DC | PRN
  Administered 2012-03-16: 1 mg via ORAL
  Filled 2012-03-16: qty 1

## 2012-03-16 MED ORDER — NICOTINE 21 MG/24HR TD PT24
21.0000 mg | MEDICATED_PATCH | Freq: Every day | TRANSDERMAL | Status: DC
  Administered 2012-03-17 – 2012-03-19 (×3): 21 mg via TRANSDERMAL
  Filled 2012-03-16 (×6): qty 1

## 2012-03-16 MED ORDER — ONDANSETRON HCL 4 MG PO TABS: 4.0000 mg | ORAL_TABLET | Freq: Three times a day (TID) | ORAL | Status: DC | PRN

## 2012-03-16 MED ORDER — ACETAMINOPHEN 325 MG PO TABS: 650.0000 mg | ORAL_TABLET | Freq: Four times a day (QID) | ORAL | Status: DC | PRN

## 2012-03-16 MED ORDER — HALOPERIDOL LACTATE 5 MG/ML IJ SOLN
10.0000 mg | Freq: Once | INTRAMUSCULAR | Status: AC
  Administered 2012-03-16: 10 mg via INTRAMUSCULAR
  Filled 2012-03-16: qty 2

## 2012-03-16 MED ORDER — MAGNESIUM HYDROXIDE 400 MG/5ML PO SUSP: 30.0000 mL | Freq: Every day | ORAL | Status: DC | PRN

## 2012-03-16 NOTE — ED Notes (Signed)
Report given to Rodell Perna, Charity fundraiser at behavioral health. Ready to receive patient. [employer] called for transport.

## 2012-03-16 NOTE — ED Notes (Signed)
Awaiting [facility] consultation at this time

## 2012-03-16 NOTE — Tx Team (Signed)
Initial Interdisciplinary Treatment Plan  PATIENT STRENGTHS: (choose at least two) Ability for insight Active sense of humor Average or above average intelligence Capable of independent living Communication skills General fund of knowledge Motivation for treatment/growth Physical Health Religious Affiliation Supportive family/friends Work skills  PATIENT STRESSORS: Medication change or noncompliance Substance abuse   PROBLEM LIST: Problem List/Patient Goals Date to be addressed Date deferred Reason deferred Estimated date of resolution  "I want to get my rest back my mind is 120 mph" 03/16/12           Substance abuse 03/16/12     Increased risk for suicide 03/16/12                                    DISCHARGE CRITERIA:  Ability to meet basic life and health needs Adequate post-discharge living arrangements Improved stabilization in mood, thinking, and/or behavior Motivation to continue treatment in a less acute level of care Need for constant or close observation no longer present Reduction of life-threatening or endangering symptoms to within safe limits Safe-care adequate arrangements made Verbal commitment to aftercare and medication compliance  PRELIMINARY DISCHARGE PLAN: Outpatient therapy Participate in family therapy Return to previous living arrangement Return to previous work or school arrangements  PATIENT/FAMIILY INVOLVEMENT: This treatment plan has been presented to and reviewed with the patient, [name], and/or family member.  The patient and family have been given the opportunity to ask questions and make suggestions.  Fransico [facility] 03/16/2012, 9:54 PM

## 2012-03-16 NOTE — ED Notes (Signed)
[employer] to remain with patient at this time for safety.

## 2012-03-16 NOTE — ED Notes (Signed)
Patient provided iced water per request. 

## 2012-03-16 NOTE — ED Notes (Signed)
According to pt's mother, pt has been delusional for "a while", has had problems like this before and is concerned for his safety,. Feels that he needs IVC
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
## 2012-03-16 NOTE — ED Notes (Signed)
Dr Adriana Simas in to explain to patient about admission. Patient gave verbal understanding.

## 2012-03-16 NOTE — Progress Notes (Signed)
Patient ID: [name], male   DOB: 1985/12/18, 27 y.o.   MRN: [id number]  Pt was pleasant and cooperative, but hypomanic during the adm process. Pt informed the writer that he was "having manic thoughts". Stated, "A shot of haldol made me realize I was thinking some shit".  Pt stated "he stopped taking his meds approx 1 week ago, because he wanted to smoke weed". States he realizes he can't smoke smoke it now. States now he wants to take his meds the "right" way. Stated that he was sober 2.5 yrs until 3 days ago. Pt is alert and oriented and has no thoughts of delusion at this time.

## 2012-03-16 NOTE — ED Notes (Signed)
Tommy with ACT with patient.

## 2012-03-16 NOTE — ED Notes (Signed)
[employer] at bedside. Patient made IVC by Dr Adriana Simas.
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
## 2012-03-16 NOTE — ED Notes (Signed)
Patient left with [employer] at this time for transport to [facility].

## 2012-03-16 NOTE — ED Notes (Signed)
Pt brought to er by family, pt hasrequest for "physical", pt states that he is going to [place] to fix the deficit and work for [employer], pt denies any SI/HI, denies any visual or auditory hallucinations.

## 2012-03-16 NOTE — ED Notes (Signed)
Patient wanting clothes and to leave. Feels like we are "using" him. Explained to patient situation. [employer] called due to patient IVC status.
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
## 2012-03-17 ENCOUNTER — Encounter (HOSPITAL_COMMUNITY): Payer: Self-pay | Admitting: Psychiatry

## 2012-03-17 DIAGNOSIS — F3110 Bipolar disorder, current episode manic without psychotic features, unspecified: Principal | ICD-10-CM

## 2012-03-17 DIAGNOSIS — F310 Bipolar disorder, current episode hypomanic: Secondary | ICD-10-CM

## 2012-03-17 DIAGNOSIS — F319 Bipolar disorder, unspecified: Secondary | ICD-10-CM

## 2012-03-17 MED ORDER — OLANZAPINE 10 MG PO TBDP: 10.0000 mg | ORAL_TABLET | Freq: Three times a day (TID) | ORAL | Status: DC | PRN

## 2012-03-17 NOTE — Progress Notes (Signed)
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 Continues on 15 minute checks. R: pt safety maintained.

## 2012-03-17 NOTE — BHH Suicide Risk Assessment (Signed)
Suicide Risk Assessment  Admission Assessment     Nursing information obtained from:  Patient Demographic factors:  Male;Caucasian;Adolescent or young adult Current Mental Status:  NA Loss Factors:  NA Historical Factors:  Prior suicide attempts;Impulsivity Risk Reduction Factors:  Sense of responsibility to family;Religious beliefs about death;Employed;Living with another person, especially a relative;Positive social support  CLINICAL FACTORS:   Bipolar Disorder:   Mixed State  COGNITIVE FEATURES THAT CONTRIBUTE TO RISK:  Closed-mindedness Polarized thinking    SUICIDE RISK:   Minimal: No identifiable suicidal ideation.  Patients presenting with no risk factors but with morbid ruminations; may be classified as minimal risk based on the severity of the depressive symptoms  PLAN OF CARE:1. Admit for crisis management and stabilization. 2. Medication management to reduce current symptoms to base line and improve the patient's overall level of functioning 3. Treat health problems as indicated. 4. Develop treatment plan to decrease risk of relapse upon discharge and the need for  readmission. 5. Psycho-social education regarding relapse prevention and self care. 6. Health care follow up as needed for medical problems. 7. Restart home medications where appropriate.     Kevin Aiello,MD 03/17/2012, 11:09 AM

## 2012-03-17 NOTE — H&P (Signed)
Psychiatric Admission Assessment Adult  Patient Identification:  [name] Date of Evaluation:  03/17/2012  Chief Complaint:  "I went to the emergency room to get a physical before I moved to [facility]"  History of Present Illness:Patient is 27 year old man with history of Bipolar disorder who reports that prior to his admission he had gone to the emergency to get a "physical" before he moved to [facility]. Patient also reports that he stopped taking his medications and has been smoking Marijuana. He reports racing thoughts, lack of need to sleep, euphoric mood and poor impulse control. Patient has been having grandiose delusions, he told his mother that he got a job in [place] to help [employer] fix the [employer]. He denied psychotic symptoms.  Elements:  Location:  [facility] inpatient. Quality:  chronic. Severity:  recurrent. Timing:  manic symptoms in the last few weeks.. Duration:  History of bipolar disorder since teenager.. Context:  patient stopped taking his medications and smoking Marijuana.. Associated Signs/Synptoms: Depression Symptoms:  insomnia, psychomotor agitation, (Hypo) Manic Symptoms:  Delusions, Distractibility, Flight of Ideas, Grandiosity, Impulsivity, Anxiety Symptoms:  None reported Psychotic Symptoms:  Delusions, PTSD Symptoms: None reported  Psychiatric Specialty Exam: Physical Exam  Psychiatric: His mood appears anxious. His speech is rapid and/or pressured and tangential. Thought content is delusional. Cognition and memory are normal. He expresses impulsivity and inappropriate judgment. He is inattentive.    Review of Systems  Constitutional: Negative.   HENT: Negative.   Eyes: Negative.   Respiratory: Negative.   Cardiovascular: Negative.   Gastrointestinal: Negative.   Genitourinary: Negative.   Musculoskeletal: Negative.   Skin: Negative.   Neurological: Negative.   Endo/Heme/Allergies: Negative.   Psychiatric/Behavioral:  Positive for substance abuse. The patient is nervous/anxious and has insomnia.        Hypomania    Blood pressure 119/81, pulse 121, temperature 98 F (36.7 C), temperature source Oral, resp. rate 18, height 6\' 6"  (1.981 m), weight 113.399 kg (250 lb).Body mass index is 28.89 kg/(m^2).  General Appearance: Casual and Fairly Groomed  Patent attorney::  Good  Speech:  Pressured  Volume:  Increased  Mood:  Euphoric  Affect:  Labile and Full Range  Thought Process:  Loose  Orientation:  Full (Time, Place, and Person)  Thought Content:  Delusions  Suicidal Thoughts:  No  Homicidal Thoughts:  No  Memory:  Immediate;   Fair Recent;   Fair Remote;   Fair  Judgement:  Poor  Insight:  Lacking  Psychomotor Activity:  Increased  Concentration:  Poor  Recall:  Fair  Akathisia:  No  Handed:  Right  AIMS (if indicated):     Assets:  Communication Skills Desire for Improvement Physical Health Social Support  Sleep:  Number of Hours: 6.5     Past Psychiatric History: Diagnosis:  Hospitalizations:  Outpatient Care:  Substance Abuse Care:  Self-Mutilation:  Suicidal Attempts:  Violent Behaviors:   Past Medical History:   Past Medical History  Diagnosis Date  . Brain injury   . Bipolar 1 disorder     Allergies:  No Known Allergies PTA Medications: Prescriptions prior to admission  Medication Sig Dispense Refill  . carbamazepine (TEGRETOL) 200 MG tablet Take 200-400 mg by mouth 2 (two) times daily. 400 mg (2 tablets) in the am and 200 mg (1 tablet) in the pm      . citalopram (CELEXA) 20 MG tablet Take 20 mg by mouth daily.      . risperiDONE (  RISPERDAL) 2 MG tablet Take 2 mg by mouth at bedtime.      . risperiDONE (RISPERDAL) 2 MG tablet Take 1 tablet (2 mg total) by mouth at bedtime.  30 tablet  0    Previous Psychotropic Medications:  Medication/Dose                 Substance Abuse History in the last 12 months:  yes  Consequences of Substance Abuse: NA  Social  History:  reports that he has been smoking Cigarettes.  He has a 4 pack-year smoking history. He does not have any smokeless tobacco history on file. He reports that he uses illicit drugs (Marijuana). He reports that he does not drink alcohol. Additional Social History: Pain Medications: na History of alcohol / drug use?: Yes Name of Substance 1: THC 1 - Age of First Use: 14 yr 1 - Last Use / Amount: sober for 20.5 yrs until 3 days ago                  Current Place of Residence:   Place of Birth:   Family Members: Marital Status:  Separated Children:  Sons:  Daughters: Relationships: Education:  Corporate treasurer Problems/Performance: Religious Beliefs/Practices: History of Abuse (Emotional/Phsycial/Sexual) Occupational Experiences; Military History:  None. Legal History: Hobbies/Interests:  Family History:  History reviewed. No pertinent family history.  Results for orders placed during the hospital encounter of 03/16/12 (from the past 72 hour(s))  CBC WITH DIFFERENTIAL     Status: Abnormal   Collection Time   03/16/12 10:50 AM      Component Value Range Comment   WBC 6.0  4.0 - 10.5 K/uL    RBC 4.90  4.22 - 5.81 MIL/uL    Hemoglobin 15.2  13.0 - 17.0 g/dL    HCT 16.1  09.6 - 04.5 %    MCV 91.6  78.0 - 100.0 fL    MCH 31.0  26.0 - 34.0 pg    MCHC 33.9  30.0 - 36.0 g/dL    RDW 40.9  81.1 - 91.4 %    Platelets 302  150 - 400 K/uL    Neutrophils Relative 48  43 - 77 %    Neutro Abs 2.9  1.7 - 7.7 K/uL    Lymphocytes Relative 37  12 - 46 %    Lymphs Abs 2.2  0.7 - 4.0 K/uL    Monocytes Relative 13 (*) 3 - 12 %    Monocytes Absolute 0.8  0.1 - 1.0 K/uL    Eosinophils Relative 1  0 - 5 %    Eosinophils Absolute 0.1  0.0 - 0.7 K/uL    Basophils Relative 1  0 - 1 %    Basophils Absolute 0.0  0.0 - 0.1 K/uL   BASIC METABOLIC PANEL     Status: Abnormal   Collection Time   03/16/12 10:50 AM      Component Value Range Comment   Sodium 138  135 - 145 mEq/L     Potassium 3.0 (*) 3.5 - 5.1 mEq/L    Chloride 100  96 - 112 mEq/L    CO2 30  19 - 32 mEq/L    Glucose, Bld 112 (*) 70 - 99 mg/dL    BUN 8  6 - 23 mg/dL    Creatinine, Ser 7.82  0.50 - 1.35 mg/dL    Calcium 9.4  8.4 - 95.6 mg/dL    GFR calc non Af Amer >90  >90 mL/min  GFR calc Af Amer >90  >90 mL/min   ETHANOL     Status: Normal   Collection Time   03/16/12 10:50 AM      Component Value Range Comment   Alcohol, Ethyl (B) <11  0 - 11 mg/dL   URINE RAPID DRUG SCREEN (HOSP PERFORMED)     Status: Abnormal   Collection Time   03/16/12 11:35 AM      Component Value Range Comment   Opiates NONE DETECTED  NONE DETECTED    Cocaine NONE DETECTED  NONE DETECTED    Benzodiazepines NONE DETECTED  NONE DETECTED    Amphetamines NONE DETECTED  NONE DETECTED    Tetrahydrocannabinol POSITIVE (*) NONE DETECTED    Barbiturates NONE DETECTED  NONE DETECTED    Psychological Evaluations:  Assessment:   AXIS I:  Bipolar disorder, current episode hypomanic  AXIS II:  Deferred AXIS III:   Past Medical History  Diagnosis Date  . Brain injury    AXIS IV:  other psychosocial or environmental problems, problems related to legal system/crime and problems related to social environment AXIS V:  21-30 behavior considerably influenced by delusions or hallucinations OR serious impairment in judgment, communication OR inability to function in almost all areas  Treatment Plan/Recommendations: 1. Admit for crisis management and stabilization. 2. Medication management to reduce current symptoms to base line and improve the patient's overall level of functioning 3. Treat health problems as indicated. 4. Develop treatment plan to decrease risk of relapse upon discharge and the need for readmission. 5. Psycho-social education regarding relapse prevention and self care. 6. Health care follow up as needed for medical problems. 7. Restart home medications where appropriate.    Treatment Plan Summary: Daily contact  with patient to assess and evaluate symptoms and progress in treatment Medication management Current Medications:  Current Facility-Administered Medications  Medication Dose Route Frequency Provider Last Rate Last Dose  . acetaminophen (TYLENOL) tablet 650 mg  650 mg Oral Q6H PRN Kerry Hough, PA      . alum & mag hydroxide-simeth (MAALOX/MYLANTA) 200-200-20 MG/5ML suspension 30 mL  30 mL Oral Q4H PRN Kerry Hough, PA      . carbamazepine (TEGRETOL) tablet 200 mg  200 mg Oral Q supper Salomon Ganser   200 mg at 03/16/12 2333  . carbamazepine (TEGRETOL) tablet 400 mg  400 mg Oral Q breakfast Jonise Weightman   400 mg at 03/17/12 0805  . citalopram (CELEXA) tablet 20 mg  20 mg Oral Daily Kerry Hough, PA   20 mg at 03/17/12 0805  . hydrOXYzine (ATARAX/VISTARIL) tablet 25 mg  25 mg Oral Q6H PRN Kerry Hough, PA      . magnesium hydroxide (MILK OF MAGNESIA) suspension 30 mL  30 mL Oral Daily PRN Kerry Hough, PA      . nicotine (NICODERM CQ - dosed in mg/24 hours) patch 21 mg  21 mg Transdermal Q0600 Kerry Hough, PA   21 mg at 03/17/12 0817  . risperiDONE (RISPERDAL) tablet 2 mg  2 mg Oral QHS Kerry Hough, PA   2 mg at 03/16/12 2333    Observation Level/Precautions:  routine  Laboratory:  routine  Psychotherapy:    Medications:    Consultations:    Discharge Concerns:    Estimated LOS:  Other:     I certify that inpatient services furnished can reasonably be expected to improve the patient's condition.   Njeri Vicente,MD 1/8/201411:10 AM

## 2012-03-17 NOTE — Progress Notes (Signed)
[facility] LCSW Aftercare Discharge Planning Group Note  03/17/2012 11:58 AM  Participation Quality:  Attentive  Affect:  Appropriate  Cognitive:  Alert  Insight:  Engaged  Engagement in Group:  Engaged  Modes of Intervention:  Discussion, Exploration and Socialization  Summary of Progress/Problems:Jon states he is here because he celebrated ending 2 years of probation by smoking cannabis, This led to delusions and paranoia, symptoms he has experienced previously when smoking cannabis.  He is emphatic that he will no longer smoke cannabis.  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  Sees Dr Park Breed, psychiatrist, in [place].  Says he was here about a year ago.  [name] B 03/17/2012, 11:58 AM

## 2012-03-17 NOTE — Progress Notes (Addendum)
D:  [name] reports that he slept well and that his appetite is good.  His energy level is normal and his ability to pay attention is good.  He denies SI/HI/AVH at this time and rates depression at 1/10 and hopelessness 2/10. He reports that he is working with Materials engineer.  A:  Emotional support provided.  Medications given as ordered.  Safety checks q 15 minutes. R:  Safety maintained on unit.

## 2012-03-17 NOTE — Clinical Social Work Note (Signed)
[facility] [employer] Group Therapy  03/17/2012 , 1:45 PM    Type of Therapy:  [employer] Presentation  Participation Level:  None  Participation Quality:  Drowsy  Affect:  Blunted  Cognitive:  Oriented  Insight:  Limited  Engagement in Therapy:  Engaged  Modes of Intervention:  Discussion, Education and Socialization  Summary of Progress/Problems:  [name] from [facility] came to present his recovery story and play the guitar.  [name] slept through the first 15 minutes of group.  He awoke from another patient's outburst, and left.  [name] 03/17/2012 , 1:45 PM

## 2012-03-17 NOTE — Progress Notes (Signed)
Psychoeducational Group Note  Date:  03/17/2012 Time: 1100  Group Topic/Focus:  Personal Choices and Values:   The focus of this group is to help patients assess and explore the importance of values in their lives, how their values affect their decisions, how they express their values and what opposes their expression.  Participation Level:  Active  Participation Quality:  Appropriate, Attentive, Drowsy and Sharing  Affect:  Appropriate  Cognitive:  Appropriate  Insight:  Improving  Engagement in Group:  Engaged  Additional Comments:  Eaton attended and participated during choices and values group. Patient stated what were some negative and positive values and choices that have been made within his life span. Patient was appropriate and shared when asked to speak. 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 Patient was redirected and asked to stay on topic.   Karleen Hampshire Brittini 03/17/2012, 11:55 AM

## 2012-03-17 NOTE — Treatment Plan (Signed)
Interdisciplinary Treatment Plan Update (Adult)  Date: 03/17/2012  Time Reviewed: 8:14 AM   Progress in Treatment: Attending groups: Yes Participating in groups: Yes Taking medication as prescribed: Yes Tolerating medication: Yes   Family/Significant other contact made:  No Patient understands diagnosis:  Yes As evidenced by asking for help with substance abuse issue, psychosis Discussing patient identified problems/goals with staff:  Yes  See below Medical problems stabilized or resolved:  Yes Denies suicidal/homicidal ideation: Yes In tx team Issues/concerns per patient self-inventory:  No  Writes that he will "take my medication responsibly, no alcohol, no drugs" Other:  New problem(s) identified: N/A  Reason for Continuation of Hospitalization: Hallucinations Medication stabilization  Interventions implemented related to continuation of hospitalization: Restart home meds,  Encourage group attendance and participation  Additional comments:  Estimated length of stay: 2-4 days  Discharge Plan:return home, follow up outpt  New goal(s): N/A  Review of initial/current patient goals per problem list:   1.  Goal(s): Eliminate psychosis through the use of medication  Met:  No  Target date:1/13  As evidenced [id number] will report no delusions or hallucinations  2.  Goal (s): Identify comprehensive sobriety plan  Met:  No  Target date:1/13  As evidenced [id number] report  3.  Goal(s):Stabilize mood through the use of medications  Met:  No  Target date:1/13  As evidenced by:Jon will report resolution of symptoms of racing thoughts, poor sleep and euphoria  4.  Goal(s):  Met:  No  Target date:  As evidenced by:  Attendees: Patient:     Family:     Physician:  Thedore Mins 03/17/2012 8:14 AM   Nursing:  Waynetta Sandy  03/17/2012 8:14 AM   Clinical Social Worker:  Richelle Ito 03/17/2012 8:14 AM   Extender:  Verne Spurr PA 03/17/2012 8:14 AM   Other:     Other:       Other:     Other:      Scribe for Treatment Team:   Ida Rogue, 03/17/2012 8:14 AM

## 2012-03-17 NOTE — Progress Notes (Signed)
Psychoeducational Group Note  Date:  03/17/2012 Time:  8:15PM  Group Topic/Focus:  Wrap-Up Group:   The focus of this group is to help patients review their daily goal of treatment and discuss progress on daily workbooks.  Participation Level:  Active  Participation Quality:  Appropriate  Affect:  Appropriate  Cognitive:  Appropriate  Insight:  Developing/Improving  Engagement in Group:  Developing/Improving  Additional Comments:   Pt stated that he was here because he got off of his medications. Pt stated that one positive thing is that his family is always there for him. Pt stated that his mom is a Engineer, civil (consulting) and assists him with his medications. Pt reported that his main supports are his mom,grandma, and grandpa.   Naylin Burkle, Randal Buba 03/17/2012, 9:15 PM

## 2012-03-18 NOTE — Progress Notes (Signed)
Patient ID: [name], male   DOB: 1985-09-15, 27 y.o.   MRN: [id number] Patient has been calm and cooperative.  He is eager for discharge.  He states that he was celebrating getting off probation and ending his marriage and that is why he was smoking pot which he knows now was not a good idea.  He has been compliant with his medications.  Patient has an excellent support system in place and plans to continue with his treatment upon discharge.  Continue to monitor medication management and MD orders.  Safety checks continued every 15 minutes per protocol.    Patients behavior is appropriate; encourage and support patient as needed.

## 2012-03-18 NOTE — Progress Notes (Signed)
[facility] MD Progress Note  03/18/2012 10:08 AM [name]  MRN:  [id number]  Subjective: Patient reports decreased delusional thinking but continue to verbalize racing thoughts. He reports that he has no adverse reactions to her medications.  Diagnosis:  Bipolar disorder, current episode hypomanic   ADL's:  Intact  Sleep: Fair  Appetite:  Fair  Suicidal Ideation:  Plan:  denies Intent:  denies Means:  denies Homicidal Ideation:  Plan:  denies Intent:  denies Means:  denies AEB (as evidenced by):  Psychiatric Specialty Exam: Review of Systems  Constitutional: Negative.   HENT: Negative.   Eyes: Negative.   Respiratory: Negative.   Cardiovascular: Negative.   Gastrointestinal: Negative.   Genitourinary: Negative.   Musculoskeletal: Negative.   Skin: Negative.   Neurological: Negative.   Endo/Heme/Allergies: Negative.   Psychiatric/Behavioral: Positive for substance abuse. The patient has insomnia.     Blood pressure 123/81, pulse 116, temperature 96.7 F (35.9 C), temperature source Oral, resp. rate 20, height 6\' 6"  (1.981 m), weight 113.399 kg (250 lb).Body mass index is 28.89 kg/(m^2).  General Appearance: Casual  Eye Contact::  Fair  Speech:  Pressured  Volume:  Normal  Mood:  Euphoric  Affect:  Full Range  Thought Process:  Disorganized  Orientation:  Full (Time, Place, and Person)  Thought Content:  Delusions  Suicidal Thoughts:  No  Homicidal Thoughts:  No  Memory:  Immediate;   Fair Recent;   Fair Remote;   Fair  Judgement:  Impaired  Insight:  Shallow  Psychomotor Activity:  Increased  Concentration:  Fair  Recall:  Fair  Akathisia:  No  Handed:  Right  AIMS (if indicated):     Assets:  Desire for Improvement Physical Health  Sleep:  Number of Hours: 6.75    Current Medications: Current Facility-Administered Medications  Medication Dose Route Frequency Provider Last Rate Last Dose  . acetaminophen (TYLENOL) tablet 650 mg  650 mg Oral Q6H  PRN Kerry Hough, PA      . alum & mag hydroxide-simeth (MAALOX/MYLANTA) 200-200-20 MG/5ML suspension 30 mL  30 mL Oral Q4H PRN Kerry Hough, PA      . carbamazepine (TEGRETOL) tablet 200 mg  200 mg Oral Q supper Abagael Kramm   200 mg at 03/17/12 1711  . carbamazepine (TEGRETOL) tablet 400 mg  400 mg Oral Q breakfast Estevon Fluke   400 mg at 03/18/12 0742  . citalopram (CELEXA) tablet 20 mg  20 mg Oral Daily Kerry Hough, PA   20 mg at 03/18/12 9528  . hydrOXYzine (ATARAX/VISTARIL) tablet 25 mg  25 mg Oral Q6H PRN Kerry Hough, PA      . magnesium hydroxide (MILK OF MAGNESIA) suspension 30 mL  30 mL Oral Daily PRN Kerry Hough, PA      . nicotine (NICODERM CQ - dosed in mg/24 hours) patch 21 mg  21 mg Transdermal Q0600 Kerry Hough, PA   21 mg at 03/18/12 4132  . OLANZapine zydis (ZYPREXA) disintegrating tablet 10 mg  10 mg Oral Q8H PRN Daxtin Leiker      . risperiDONE (RISPERDAL) tablet 2 mg  2 mg Oral QHS Kerry Hough, PA   2 mg at 03/17/12 2143    Lab Results:  Results for orders placed during the hospital encounter of 03/16/12 (from the past 48 hour(s))  CBC WITH DIFFERENTIAL     Status: Abnormal   Collection Time   03/16/12 10:50 AM  Component Value Range Comment   WBC 6.0  4.0 - 10.5 K/uL    RBC 4.90  4.22 - 5.81 MIL/uL    Hemoglobin 15.2  13.0 - 17.0 g/dL    HCT 16.1  09.6 - 04.5 %    MCV 91.6  78.0 - 100.0 fL    MCH 31.0  26.0 - 34.0 pg    MCHC 33.9  30.0 - 36.0 g/dL    RDW 40.9  81.1 - 91.4 %    Platelets 302  150 - 400 K/uL    Neutrophils Relative 48  43 - 77 %    Neutro Abs 2.9  1.7 - 7.7 K/uL    Lymphocytes Relative 37  12 - 46 %    Lymphs Abs 2.2  0.7 - 4.0 K/uL    Monocytes Relative 13 (*) 3 - 12 %    Monocytes Absolute 0.8  0.1 - 1.0 K/uL    Eosinophils Relative 1  0 - 5 %    Eosinophils Absolute 0.1  0.0 - 0.7 K/uL    Basophils Relative 1  0 - 1 %    Basophils Absolute 0.0  0.0 - 0.1 K/uL   BASIC METABOLIC PANEL     Status: Abnormal     Collection Time   03/16/12 10:50 AM      Component Value Range Comment   Sodium 138  135 - 145 mEq/L    Potassium 3.0 (*) 3.5 - 5.1 mEq/L    Chloride 100  96 - 112 mEq/L    CO2 30  19 - 32 mEq/L    Glucose, Bld 112 (*) 70 - 99 mg/dL    BUN 8  6 - 23 mg/dL    Creatinine, Ser 7.82  0.50 - 1.35 mg/dL    Calcium 9.4  8.4 - 95.6 mg/dL    GFR calc non Af Amer >90  >90 mL/min    GFR calc Af Amer >90  >90 mL/min   ETHANOL     Status: Normal   Collection Time   03/16/12 10:50 AM      Component Value Range Comment   Alcohol, Ethyl (B) <11  0 - 11 mg/dL   URINE RAPID DRUG SCREEN (HOSP PERFORMED)     Status: Abnormal   Collection Time   03/16/12 11:35 AM      Component Value Range Comment   Opiates NONE DETECTED  NONE DETECTED    Cocaine NONE DETECTED  NONE DETECTED    Benzodiazepines NONE DETECTED  NONE DETECTED    Amphetamines NONE DETECTED  NONE DETECTED    Tetrahydrocannabinol POSITIVE (*) NONE DETECTED    Barbiturates NONE DETECTED  NONE DETECTED     Physical Findings: AIMS: Facial and Oral Movements Muscles of Facial Expression: None, normal Lips and Perioral Area: None, normal Jaw: None, normal Tongue: None, normal,Extremity Movements Upper (arms, wrists, hands, fingers): None, normal Lower (legs, knees, ankles, toes): None, normal, Trunk Movements Neck, shoulders, hips: None, normal, Overall Severity Severity of abnormal movements (highest score from questions above): None, normal Incapacitation due to abnormal movements: None, normal Patient's awareness of abnormal movements (rate only patient's report): No Awareness, Dental Status Current problems with teeth and/or dentures?: No Does patient usually wear dentures?: No  CIWA:  CIWA-Ar Total: 0  COWS:     Treatment Plan Summary: Daily contact with patient to assess and evaluate symptoms and progress in treatment Medication management  Plan: 1. Patient to continue current medication regimen. 2. Patient encouraged to  attend group and other unit milieu.  Medical Decision Making Problem Points:  Established problem, stable/improving (1), Review of last therapy session (1) and Review of psycho-social stressors (1) Data Points:  Order Aims Assessment (2) Review of medication regiment & side effects (2) Review of new medications or change in dosage (2)  I certify that inpatient services furnished can reasonably be expected to improve the patient's condition.   Venetia Prewitt,MD 03/18/2012, 10:08 AM

## 2012-03-18 NOTE — Progress Notes (Signed)
Patient resting quietly with eyes closed; respirations even and unlabored. No distress noted. Q 15 minute check continues as ordered to maintain safety.  

## 2012-03-18 NOTE — Progress Notes (Signed)
Psychoeducational Group Note  Date:  03/18/2012 Time:  0930  Group Topic/Focus:  Building Self Esteem:   The Focus of this group is helping patients become aware of the effects of self-esteem on their lives, the things they and others do that enhance or undermine their self-esteem, seeing the relationship between their level of self-esteem and the choices they make and learning ways to enhance self-esteem.  Participation Level:  Minimal  Participation Quality:  Appropriate and Attentive  Affect:  Appropriate  Cognitive:  Appropriate  Insight:  Improving  Engagement in Group:  Improving  Additional Comments:  [name] attended and was appropriate during  group. Patient stated one thing he like about himself and how he could improve his self esteem. During group it was discussed what low and self esteem was and how patient defined self-esteem in own terms.   [name] 03/18/2012, 10:52 AM

## 2012-03-18 NOTE — Clinical Social Work Note (Signed)
[facility] Group Notes:  (Counselor/Nursing/MHT/Case Management/Adjunct)  01/22/2012 2:20 PM  Type of Therapy:  Group Therapy  Participation Level:  Active  Participation Quality:  Appropriate  Affect:  Appropriate  Cognitive:  Alert  Insight:  Limited  Engagement in Group:  Engaged  Engagement in Therapy:  Limited  Modes of Intervention:  Discussion, Education, Exploration and Socialization  Summary of Progress/Problems: .balance: The topic for group was balance in life.  Pt participated in the discussion about when their life was in balance and out of balance and how this feels.  Pt discussed ways to get back in balance and short term goals they can work on to get where they want to be. Cletis Athens 's specific plan for balance was to get a good meal at [facility], but also admitted that smoking cannabis sends him out of balance.   [name] B 01/22/2012, 2:20 PM

## 2012-03-18 NOTE — Progress Notes (Signed)
Patient ID: [name], male   DOB: 12-19-1985, 27 y.o.   MRN: [id number] D: No new data from previous assessment. Patient in bed sleeping. Respiration regular and unlabored. No sign of distress noted at this time A: 15 mins checks for  Safety. R: Patient remains asleep. Pt is safe.

## 2012-03-18 NOTE — Progress Notes (Signed)
[facility] LCSW Aftercare Discharge Planning Group Note  03/18/2012 12:33 PM  Participation Quality:  Attentive  Affect:  Appropriate  Cognitive:  Oriented  Insight:  Improving  Engagement in Group:  Engaged  Modes of Intervention:  Discussion, Exploration and Socialization  Summary of Progress/Problems:Jon appeared to enjoy group this AM.  He stated he is hoping to be discharged tomorrow, Sat at the latest.  Denies SI, HI, hallucinations.  [name] B 03/18/2012, 12:33 PM

## 2012-03-18 NOTE — Progress Notes (Signed)
Adult Psychosocial Assessment Update Interdisciplinary Team  Previous [facility] admissions/discharges:  Admissions Discharges  Date:Current Date:  Date:03/20/11 Date:  Date: Date:  Date: Date:  Date: Date:   Changes since the last Psychosocial Assessment (including adherence to outpatient mental health and/or substance abuse treatment, situational issues contributing to decompensation and/or relapse).   [name] getting off of probation by smoking cannabis, and it sent him on a downward spiral.  He is again an avowed cannabis abstainer, and states he will never smoke again.  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  Plans to return there and follow up with his psychiatrist in [place] post d/c.             Discharge Plan 1. Will you be returning to the same living situation after discharge?   Yes:X No:      If no, what is your plan?           2. Would you like a referral for services when you are discharged? Yes:     If yes, for what services?  No:  X            Summary and Recommendations (to be completed by the evaluator) [name] is a 27 yo Caucasian male who was admitted for delusions, hallucinations.  He can benefit from medication management, therapeutic milieu and referral to outpt services.                       Signature:  Ida Rogue, 03/18/2012 12:38 PM

## 2012-03-19 MED ORDER — CITALOPRAM HYDROBROMIDE 20 MG PO TABS: 20.0000 mg | ORAL_TABLET | Freq: Every day | ORAL | Status: DC

## 2012-03-19 MED ORDER — CARBAMAZEPINE 200 MG PO TABS: ORAL_TABLET | ORAL | Status: DC

## 2012-03-19 MED ORDER — RISPERIDONE 2 MG PO TABS: 2.0000 mg | ORAL_TABLET | Freq: Every day | ORAL | Status: DC

## 2012-03-19 NOTE — [facility] Suicide Risk Assessment (Signed)
Suicide Risk Assessment  Discharge Assessment     Demographic Factors:  Male  Mental Status Per Nursing Assessment::   On Admission:  NA  Current Mental Status by Physician: patient denies suicidal ideation, intent or plan  Loss Factors: Decrease in vocational status  Historical Factors: NA  Risk Reduction Factors:   Living with another person, especially a relative, Positive social support and Positive therapeutic relationship  Continued Clinical Symptoms:  Bipolar Disorder:   Depressive phase  Cognitive Features That Contribute To Risk:  Closed-mindedness Polarized thinking    Suicide Risk:  Minimal: No identifiable suicidal ideation.  Patients presenting with no risk factors but with morbid ruminations; may be classified as minimal risk based on the severity of the depressive symptoms  Discharge Diagnoses:   AXIS I:  Bipolar disorder, current episode hypomanic  AXIS II:  Deferred AXIS III:   Past Medical History  Diagnosis Date  . Brain injury    AXIS IV:  other psychosocial or environmental problems AXIS V:  61-70 mild symptoms  Plan Of Care/Follow-up recommendations:  Activity:  as tolerated Diet:  healthy Tests:  Tegretol level  Other:  patient to keep his after care appointment  Is patient on multiple antipsychotic therapies at discharge:  No   Has Patient had three or more failed trials of antipsychotic monotherapy by history:  No  Recommended Plan for Multiple Antipsychotic Therapies: N/A  Courtenay Hirth,MD 03/19/2012, 9:45 AM

## 2012-03-19 NOTE — Progress Notes (Signed)
Patient ID: OWENS HARA, male   DOB: 1985-05-03, 27 y.o.   MRN: [id number]  D: Pt denies SI/HI/AVH. Pt is pleasant and cooperative. Pt ready to be D/C   A: Pt was offered support and encouragement. Pt was given scheduled medications. Pt was encourage to attend groups. Q 15 minute checks were done for safety.    R:Pt attends groups and interacts well with peers and staff. Pt is taking medication. Pt has no complaints at this time.Pt receptive to treatment and safety maintained on unit.

## 2012-03-19 NOTE — Progress Notes (Signed)
[facility] Adult Case Management Discharge Plan :  Will you be returning to the same living situation after discharge: No. At discharge, do you have transportation home?:Yes,  mother Do you have the ability to pay for your medications:Yes,  MCD  Release of information consent forms completed and in the chart;  Patient's signature needed at discharge.  Patient to Follow up at: Follow-up Information    Follow up with [facility]. On 03/24/2012. (2:30 with Dr [name])    Contact information:   [address]  [address]   [place]  [[phone]] [phone]         Patient denies SI/HI:   Yes,  yes    Safety Planning and Suicide Prevention discussed:  Yes,  yes  [name] 03/19/2012, 10:14 AM

## 2012-03-19 NOTE — Progress Notes (Signed)
Psychoeducational Group Note  Date:  03/19/2012 Time: 0930  Group Topic/Focus:  Therapeutic Activity  Participation Level:  Active  Participation Quality:  Appropriate and Attentive  Affect:  Appropriate  Cognitive:  Appropriate  Insight:  Engaged  Engagement in Group:  Engaged  Additional Comments:  Bilbo attended and participated during group. The group consisted of using the question ball for a therapeutic activity, where the patient would answer a question on the ball and then pass to a fellow group member. Patient was cooperative and appropriate during the group.   Karleen Hampshire Brittini 03/19/2012, 11:28 AM

## 2012-03-19 NOTE — Progress Notes (Signed)
Pt is D/C home. Pt denies SI/HI/AV. Pt follow-up and medications were reviewed and pt verbalized understanding. Pt pleasant and cooperative. Pt belongings were returned.   

## 2012-03-24 NOTE — Progress Notes (Signed)
Patient Discharge Instructions:  Incomplete Release of Information.  No documentation faxed to [facility] Dr Loney Hering.  [name], 03/24/2012, 4:20 PM

## 2012-03-28 NOTE — Discharge Summary (Signed)
Physician Discharge Summary Note  Patient:  [name] is an 27 y.o., male MRN:  [id number] DOB:  1985/07/12 Patient phone:  [phone] (home)  Patient address:   [address] [place] [address],   Date of Admission:  03/16/2012 Date of Discharge: 03/19/2012  Reason for Admission: "Mania"  Discharge Diagnoses: Principal Problem:  *Bipolar disorder, current episode hypomanic  Discharge Diagnoses:  AXIS I: Bipolar disorder, current episode hypomanic  AXIS II: Deferred  AXIS III:  Past Medical History   Diagnosis  Date   .  Brain injury    AXIS IV: other psychosocial or environmental problems  AXIS V: 61-70 mild symptoms Review of Systems  Constitutional: Negative.  Negative for fever, chills, weight loss, malaise/fatigue and diaphoresis.  HENT: Negative for congestion and sore throat.   Eyes: Negative for blurred vision, double vision and photophobia.  Respiratory: Negative for cough, shortness of breath and wheezing.   Cardiovascular: Negative for chest pain, palpitations and PND.  Gastrointestinal: Negative for heartburn, nausea, vomiting, abdominal pain, diarrhea and constipation.  Musculoskeletal: Negative for myalgias, joint pain and falls.  Neurological: Negative for dizziness, tingling, tremors, sensory change, speech change, focal weakness, seizures, loss of consciousness, weakness and headaches.  Endo/Heme/Allergies: Negative for polydipsia. Does not bruise/bleed easily.  Psychiatric/Behavioral: Negative for depression, suicidal ideas, hallucinations, memory loss and substance abuse. The patient is not nervous/anxious and does not have insomnia.     Level of Care:  OP  Hospital Course:  This was an involuntary admission for [name] who presented to the [facility] in full blown mania. He was requesting a physical before he went to [place] to do God's work.  He stated that he was taking his medications and admitted to doing cocaine 4 weeks prior but used  marijuana regularly.  He was grandiose, labile, with pressured speech, and flight of ideas.  He was given medical clearance and transferred to [facility] for stabilization and medication management.      His symptoms of mania were treated with Risperdone 2mg  at bedtime.  For mood stabilization he was placed on Tegratol 200mg  and for depression he was started on Celexa 20mg .  [name] was evaluated daily by clinical providers.  His response to treatment was assessed by his affect, verbabal response, reporting a decrease in symptoms and his participation in unit programming.  [name] reported no side effects to the medication.        By the 3rd day of admission he was reporting a significant reduction in symptoms and was felt to be stable for discharge. He denied SI/HI and reported no AVH. He was in much improved condition than upon admission.        Consults:  None  Significant Diagnostic Studies:  labs: please see all associated labs for this admission.  Discharge Vitals:   Blood pressure 138/92, pulse 128, temperature 97.4 F (36.3 C), temperature source Oral, resp. rate 18, height 6\' 6"  (1.981 m), weight 113.399 kg (250 lb). Body mass index is 28.89 kg/(m^2). Lab Results:   No results found for this or any previous visit (from the past 72 hour(s)).  Physical Findings: AIMS: Facial and Oral Movements Muscles of Facial Expression: None, normal Lips and Perioral Area: None, normal Jaw: None, normal Tongue: None, normal,Extremity Movements Upper (arms, wrists, hands, fingers): None, normal Lower (legs, knees, ankles, toes): None, normal, Trunk Movements Neck, shoulders, hips: None, normal, Overall Severity Severity of abnormal movements (highest score from questions above): None, normal Incapacitation due to abnormal movements: None,  normal Patient's awareness of abnormal movements (rate only patient's report): No Awareness, Dental Status Current problems with teeth and/or dentures?: No Does  patient usually wear dentures?: No  CIWA:  CIWA-Ar Total: 0  COWS:     Psychiatric Specialty Exam: See Psychiatric Specialty Exam and Suicide Risk Assessment completed by Attending Physician prior to discharge.  Discharge destination:  Home  Is patient on multiple antipsychotic therapies at discharge:  No   Has Patient had three or more failed trials of antipsychotic monotherapy by history:  No  Recommended Plan for Multiple Antipsychotic Therapies: Not applicable   Discharge Orders    Future Orders Please Complete By Expires   Diet - low sodium heart healthy      Increase activity slowly      Discharge instructions      Comments:   Take all your medications as prescribed by your mental healthcare provider. Report any adverse effects and or reactions from your medicines to your outpatient provider promptly. Patient is instructed and cautioned to not engage in alcohol and or illegal drug use while on prescription medicines. In the event of worsening symptoms, patient is instructed to call the crisis hotline, 911 and or go to the nearest ED for appropriate evaluation and treatment of symptoms. Follow-up with your primary care provider for your other medical issues, concerns and or health care needs.          Follow-up Information    Follow up with [facility]. On 03/24/2012. (2:30 with Dr Welton Flakes)    Contact information:   [address]  [address]   [place]  [[phone]] [phone]         Follow-up recommendations:  As noted above  Comments:    Total Discharge Time:  >30 minutes  Signed: Lloyd Huger T. Jahvon Gosline PAC 03/28/2012, 1:57 PM

## 2012-03-29 NOTE — Discharge Summary (Signed)
Seen and agreed. Londyn Wotton, MD 

## 2021-04-01 ENCOUNTER — Emergency Department (HOSPITAL_COMMUNITY): Payer: Medicaid Other

## 2021-04-01 ENCOUNTER — Encounter (HOSPITAL_COMMUNITY): Payer: Self-pay | Admitting: *Deleted

## 2021-04-01 ENCOUNTER — Inpatient Hospital Stay (HOSPITAL_COMMUNITY)
Admission: EM | Admit: 2021-04-01 | Discharge: 2021-04-05 | DRG: 872 | Disposition: A | Discharge status: Home / Self Care | Payer: Medicaid Other | Attending: Internal Medicine | Admitting: Internal Medicine

## 2021-04-01 DIAGNOSIS — Z20822 Contact with and (suspected) exposure to covid-19: Secondary | ICD-10-CM | POA: Diagnosis present

## 2021-04-01 DIAGNOSIS — E6609 Other obesity due to excess calories: Secondary | ICD-10-CM | POA: Diagnosis not present

## 2021-04-01 DIAGNOSIS — Z87820 Personal history of traumatic brain injury: Secondary | ICD-10-CM | POA: Diagnosis not present

## 2021-04-01 DIAGNOSIS — E669 Obesity, unspecified: Secondary | ICD-10-CM | POA: Diagnosis present

## 2021-04-01 DIAGNOSIS — K6289 Other specified diseases of anus and rectum: Secondary | ICD-10-CM | POA: Diagnosis present

## 2021-04-01 DIAGNOSIS — K631 Perforation of intestine (nontraumatic): Secondary | ICD-10-CM

## 2021-04-01 DIAGNOSIS — K529 Noninfective gastroenteritis and colitis, unspecified: Secondary | ICD-10-CM | POA: Diagnosis not present

## 2021-04-01 DIAGNOSIS — Z6835 Body mass index (BMI) 35.0-35.9, adult: Secondary | ICD-10-CM

## 2021-04-01 DIAGNOSIS — F319 Bipolar disorder, unspecified: Secondary | ICD-10-CM | POA: Diagnosis present

## 2021-04-01 DIAGNOSIS — F339 Major depressive disorder, recurrent, unspecified: Secondary | ICD-10-CM | POA: Diagnosis not present

## 2021-04-01 DIAGNOSIS — E876 Hypokalemia: Secondary | ICD-10-CM | POA: Diagnosis not present

## 2021-04-01 DIAGNOSIS — F17210 Nicotine dependence, cigarettes, uncomplicated: Secondary | ICD-10-CM | POA: Diagnosis present

## 2021-04-01 DIAGNOSIS — K5720 Diverticulitis of large intestine with perforation and abscess without bleeding: Secondary | ICD-10-CM

## 2021-04-01 DIAGNOSIS — Z79899 Other long term (current) drug therapy: Secondary | ICD-10-CM

## 2021-04-01 DIAGNOSIS — A419 Sepsis, unspecified organism: Secondary | ICD-10-CM | POA: Diagnosis not present

## 2021-04-01 HISTORY — DX: Anxiety disorder, unspecified: F41.9

## 2021-04-01 LAB — COMPREHENSIVE METABOLIC PANEL
ALT: 21 U/L (ref 0–44)
AST: 15 U/L (ref 15–41)
Albumin: 3.7 g/dL (ref 3.5–5.0)
Alkaline Phosphatase: 59 U/L (ref 38–126)
Anion gap: 11 (ref 5–15)
BUN: 10 mg/dL (ref 6–20)
CO2: 25 mmol/L (ref 22–32)
Calcium: 8.9 mg/dL (ref 8.9–10.3)
Chloride: 98 mmol/L (ref 98–111)
Creatinine, Ser: 0.88 mg/dL (ref 0.61–1.24)
GFR, Estimated: >60 mL/min (ref >60)
Glucose, Bld: 94 mg/dL (ref 70–99)
Potassium: 3.8 mmol/L (ref 3.5–5.1)
Sodium: 134 mmol/L — ABNORMAL LOW (ref 135–145)
Total Bilirubin: 0.4 mg/dL (ref 0.3–1.2)
Total Protein: 7.5 g/dL (ref 6.5–8.1)

## 2021-04-01 LAB — RESP PANEL BY RT-PCR (FLU A&B, COVID) ARPGX2
Influenza A by PCR: NEGATIVE
Influenza B by PCR: NEGATIVE
SARS Coronavirus 2 by RT PCR: NEGATIVE

## 2021-04-01 LAB — URINALYSIS, ROUTINE W REFLEX MICROSCOPIC
Bacteria, UA: NONE SEEN
Bilirubin Urine: NEGATIVE
Glucose, UA: NEGATIVE mg/dL
Ketones, ur: 5 mg/dL — AB
Leukocytes,Ua: NEGATIVE
Nitrite: NEGATIVE
Protein, ur: NEGATIVE mg/dL
Specific Gravity, Urine: 1.021 (ref 1.005–1.030)
pH: 6 (ref 5.0–8.0)

## 2021-04-01 LAB — CBC WITH DIFFERENTIAL/PLATELET
Abs Immature Granulocytes: 0.09 10*3/uL — ABNORMAL HIGH (ref 0.00–0.07)
Basophils Absolute: 0.1 10*3/uL (ref 0.0–0.1)
Basophils Relative: 0 %
Eosinophils Absolute: 0 10*3/uL (ref 0.0–0.5)
Eosinophils Relative: 0 %
HCT: 46 % (ref 39.0–52.0)
Hemoglobin: 15.7 g/dL (ref 13.0–17.0)
Immature Granulocytes: 1 %
Lymphocytes Relative: 10 %
Lymphs Abs: 1.8 10*3/uL (ref 0.7–4.0)
MCH: 32 pg (ref 26.0–34.0)
MCHC: 34.1 g/dL (ref 30.0–36.0)
MCV: 93.7 fL (ref 80.0–100.0)
Monocytes Absolute: 2.6 10*3/uL — ABNORMAL HIGH (ref 0.1–1.0)
Monocytes Relative: 15 %
Neutro Abs: 13 10*3/uL — ABNORMAL HIGH (ref 1.7–7.7)
Neutrophils Relative %: 74 %
Platelets: 261 10*3/uL (ref 150–400)
RBC: 4.91 MIL/uL (ref 4.22–5.81)
RDW: 13.7 % (ref 11.5–15.5)
WBC: 17.6 10*3/uL — ABNORMAL HIGH (ref 4.0–10.5)
nRBC: 0 % (ref 0.0–0.2)

## 2021-04-01 LAB — POC OCCULT BLOOD, ED: Occult Blood, Feces: NEGATIVE

## 2021-04-01 LAB — LIPASE, BLOOD: Lipase: 22 U/L (ref 11–51)

## 2021-04-01 LAB — LACTIC ACID, PLASMA: Lactic Acid, Venous: 1 mmol/L (ref 0.5–1.9)

## 2021-04-01 MED ORDER — ONDANSETRON HCL 4 MG/2ML IJ SOLN
4.0000 mg | Freq: Four times a day (QID) | INTRAMUSCULAR | Status: DC | PRN
Start: 2021-04-01 — End: 2021-04-05

## 2021-04-01 MED ORDER — PIPERACILLIN-TAZOBACTAM 3.375 G IVPB
3.3750 g | Freq: Three times a day (TID) | INTRAVENOUS | Status: DC
  Administered 2021-04-01 – 2021-04-05 (×11): 3.375 g via INTRAVENOUS
  Filled 2021-04-01 (×17): qty 50

## 2021-04-01 MED ORDER — ACETAMINOPHEN 325 MG PO TABS
650.0000 mg | ORAL_TABLET | Freq: Four times a day (QID) | ORAL | Status: DC | PRN
  Administered 2021-04-02 (×3): 650 mg via ORAL
  Filled 2021-04-01 (×3): qty 2

## 2021-04-01 MED ORDER — SODIUM CHLORIDE 0.9 % IV SOLN: INTRAVENOUS | Status: DC

## 2021-04-01 MED ORDER — IOHEXOL 300 MG/ML  SOLN
100.0000 mL | Freq: Once | INTRAMUSCULAR | Status: AC | PRN
  Administered 2021-04-01: 100 mL via INTRAVENOUS

## 2021-04-01 MED ORDER — DIVALPROEX SODIUM 250 MG PO DR TAB
500.0000 mg | DELAYED_RELEASE_TABLET | Freq: Every day | ORAL | Status: DC
  Administered 2021-04-01 – 2021-04-04 (×4): 500 mg via ORAL
  Filled 2021-04-01 (×4): qty 2

## 2021-04-01 MED ORDER — ONDANSETRON HCL 4 MG PO TABS: 4.0000 mg | ORAL_TABLET | Freq: Four times a day (QID) | ORAL | Status: DC | PRN

## 2021-04-01 MED ORDER — PIPERACILLIN-TAZOBACTAM 3.375 G IVPB 30 MIN
3.3750 g | Freq: Once | INTRAVENOUS | Status: AC
  Administered 2021-04-01: 3.375 g via INTRAVENOUS
  Filled 2021-04-01: qty 50

## 2021-04-01 MED ORDER — NICOTINE 21 MG/24HR TD PT24: 21.0000 mg | MEDICATED_PATCH | Freq: Every day | TRANSDERMAL | Status: DC

## 2021-04-01 MED ORDER — ACETAMINOPHEN 650 MG RE SUPP: 650.0000 mg | Freq: Four times a day (QID) | RECTAL | Status: DC | PRN

## 2021-04-01 MED ORDER — SODIUM CHLORIDE 0.9 % IV BOLUS
1000.0000 mL | Freq: Once | INTRAVENOUS | Status: AC
  Administered 2021-04-01: 1000 mL via INTRAVENOUS

## 2021-04-01 MED ORDER — CLONAZEPAM 0.5 MG PO TABS
0.5000 mg | ORAL_TABLET | Freq: Every day | ORAL | Status: DC | PRN
  Administered 2021-04-01 – 2021-04-04 (×4): 0.5 mg via ORAL
  Filled 2021-04-01 (×4): qty 1

## 2021-04-01 MED ORDER — FENTANYL CITRATE PF 50 MCG/ML IJ SOSY
50.0000 ug | PREFILLED_SYRINGE | Freq: Once | INTRAMUSCULAR | Status: AC
  Administered 2021-04-01: 50 ug via INTRAVENOUS
  Filled 2021-04-01: qty 1

## 2021-04-01 MED ORDER — SODIUM CHLORIDE 0.9 % IV BOLUS
1000.0000 mL | Freq: Once | INTRAVENOUS | Status: AC
Start: 2021-04-01 — End: 2021-04-01
  Administered 2021-04-01: 1000 mL via INTRAVENOUS

## 2021-04-01 MED ORDER — HYDROMORPHONE HCL 1 MG/ML IJ SOLN
1.0000 mg | INTRAMUSCULAR | Status: DC | PRN
  Administered 2021-04-01 – 2021-04-03 (×9): 1 mg via INTRAVENOUS
  Filled 2021-04-01 (×8): qty 1

## 2021-04-01 MED ORDER — HALOPERIDOL 5 MG PO TABS
10.0000 mg | ORAL_TABLET | Freq: Every day | ORAL | Status: DC
  Administered 2021-04-01 – 2021-04-04 (×4): 10 mg via ORAL
  Filled 2021-04-01 (×4): qty 2

## 2021-04-01 MED ORDER — ACETAMINOPHEN 500 MG PO TABS
1000.0000 mg | ORAL_TABLET | Freq: Four times a day (QID) | ORAL | Status: DC
  Administered 2021-04-01: 1000 mg via ORAL
  Filled 2021-04-01: qty 2

## 2021-04-01 MED ORDER — OXYCODONE HCL 5 MG PO TABS
5.0000 mg | ORAL_TABLET | ORAL | Status: DC | PRN
  Filled 2021-04-01: qty 1

## 2021-04-01 MED ORDER — HEPARIN SODIUM (PORCINE) 5000 UNIT/ML IJ SOLN
5000.0000 [IU] | Freq: Three times a day (TID) | INTRAMUSCULAR | Status: DC
  Administered 2021-04-01 – 2021-04-05 (×9): 5000 [IU] via SUBCUTANEOUS
  Filled 2021-04-01 (×9): qty 1

## 2021-04-01 MED ORDER — ONDANSETRON HCL 4 MG/2ML IJ SOLN
4.0000 mg | Freq: Once | INTRAMUSCULAR | Status: AC
  Administered 2021-04-01: 4 mg via INTRAVENOUS
  Filled 2021-04-01: qty 2

## 2021-04-01 MED ORDER — HYDROMORPHONE HCL 1 MG/ML IJ SOLN
0.5000 mg | INTRAMUSCULAR | Status: DC | PRN
  Filled 2021-04-01: qty 1

## 2021-04-01 MED ORDER — KCL IN DEXTROSE-NACL 20-5-0.9 MEQ/L-%-% IV SOLN: INTRAVENOUS | Status: AC

## 2021-04-01 NOTE — Progress Notes (Signed)
Pharmacy Antibiotic Note  [name] is a 36 y.o. male admitted on 04/01/2021 with  inttra-abdominal infection .  Pharmacy has been consulted for Zosyn dosing.  Plan: Zosyn 3.375g IV q8h (4 hour infusion).     Temp (24hrs), Avg:99.4 F (37.4 C), Min:98.8 F (37.1 C), Max:99.9 F (37.7 C)  Recent Labs  Lab 04/01/21 1348 04/01/21 1531  WBC 17.6*  --   CREATININE 0.88  --   LATICACIDVEN  --  1.0    CrCl cannot be calculated (Unknown ideal weight.).    No Known Allergies  Antimicrobials this admission: 1/23 Zosyn >>  Microbiology results: 1/23 BCx: pending   Thank you for allowing pharmacy to be a part of this patients care.  Blenda Nicely 04/01/2021 9:32 PM

## 2021-04-01 NOTE — ED Notes (Signed)
Called to give report again 1 hour and 5 min post original time. RN told me that I would have to wait to give report again. Once again awaiting for Rn to call back when ready for report.

## 2021-04-01 NOTE — ED Notes (Signed)
Patient bladder scanned with 13ml noted.

## 2021-04-01 NOTE — ED Provider Notes (Signed)
[facility] EMERGENCY DEPARTMENT Provider Note   CSN: [id number] Arrival date & time: 04/01/21  1225     History  Chief Complaint  Patient presents with   Abdominal Pain    [name] is a 36 y.o. male with a pmh of TBI who presents to ED with 4 days of abdominal pain and one episode of fecal incontinence that was blood tinged. States that the pain is across his lower abdomen.He denies flank pain. He has not had any additional episodes of blood in his stools but has felt constipated since that time. Denies N/V/D. He c/o pain in his rectum that hurts worse when he sits on his bottom. He is on no new medications. He denies penial discharge or testicle pain. He is not sexually active. He  reports dysuria and urgency. He endorses recent fever/chills. Denies CP, SOB. He denies back pain or saddle anesthesia. He reports no recent injuries.   Abdominal Pain     Home Medications Prior to Admission medications   Medication Sig Start Date End Date Taking? Authorizing Provider  acetaminophen (TYLENOL) 500 MG tablet Take 1,000 mg by mouth every 6 (six) hours as needed.   Yes [provider]  Atorvastatin Calcium (LIPITOR PO) Take 10 mg by mouth daily. 03/31/21  Yes [provider]  clonazePAM (KLONOPIN) 0.5 MG tablet Take 0.5 mg by mouth daily as needed. 03/30/21  Yes [provider]  divalproex (DEPAKOTE) 500 MG DR tablet Take 500 mg by mouth at bedtime. 03/27/21  Yes [provider]  haloperidol (HALDOL) 10 MG tablet Take 10 mg by mouth See admin instructions. Take 1 and 1/2 tablets every evening 03/15/21  Yes [provider]  carbamazepine (TEGRETOL) 200 MG tablet 200 mg Tablets. 1 tablet in the morning (200mg ) and 2 tablets at hs. (400mg ). Patient not taking: Reported on 04/01/2021 03/19/12   Nena Polio T, PA-C  citalopram (CELEXA) 20 MG tablet Take 1 tablet (20 mg total) by mouth daily. For depression. Patient not taking: Reported on  04/01/2021 03/19/12   Nena Polio T, PA-C  risperiDONE (RISPERDAL) 2 MG tablet Take 1 tablet (2 mg total) by mouth at bedtime. For psychosis. Patient not taking: Reported on 04/01/2021 03/19/12   Ruben Im, PA-C      Allergies    Patient has no known allergies.    Review of Systems   Review of Systems  Gastrointestinal:  Positive for abdominal pain.   Physical Exam Updated Vital Signs BP 128/81    Pulse (!) 117    Temp 99.9 F (37.7 C) (Oral)    Resp (!) 22    SpO2 97%  Physical Exam Vitals and nursing note reviewed. Exam conducted with a chaperone present.  Constitutional:      General: He is not in acute distress.    Appearance: He is well-developed. He is not diaphoretic.  HENT:     Head: Normocephalic and atraumatic.  Eyes:     General: No scleral icterus.    Conjunctiva/sclera: Conjunctivae normal.  Cardiovascular:     Rate and Rhythm: Normal rate and regular rhythm.     Heart sounds: Normal heart sounds.  Pulmonary:     Effort: Pulmonary effort is normal. No respiratory distress.     Breath sounds: Normal breath sounds.  Abdominal:     General: There is distension (Lower abdomen).     Palpations: Abdomen is soft.     Tenderness: There is abdominal tenderness in the right lower quadrant,  suprapubic area and left lower quadrant.  Genitourinary:    Penis: Normal and circumcised.      Testes: Normal.     Rectum: Normal. Guaiac result negative. No tenderness, anal fissure or external hemorrhoid. Normal anal tone.  Musculoskeletal:     Cervical back: Normal range of motion and neck supple.  Skin:    General: Skin is warm and dry.  Neurological:     Mental Status: He is alert.  Psychiatric:        Behavior: Behavior normal.    ED Results / Procedures / Treatments   Labs (all labs ordered are listed, but only abnormal results are displayed) Labs Reviewed  CBC WITH DIFFERENTIAL/PLATELET - Abnormal; Notable for the following components:      Result Value    WBC 17.6 (*)    Neutro Abs 13.0 (*)    Monocytes Absolute 2.6 (*)    Abs Immature Granulocytes 0.09 (*)    All other components within normal limits  COMPREHENSIVE METABOLIC PANEL - Abnormal; Notable for the following components:   Sodium 134 (*)    All other components within normal limits  URINALYSIS, ROUTINE W REFLEX MICROSCOPIC - Abnormal; Notable for the following components:   Hgb urine dipstick SMALL (*)    Ketones, ur 5 (*)    All other components within normal limits  CULTURE, BLOOD (ROUTINE X 2)  CULTURE, BLOOD (ROUTINE X 2)  RESP PANEL BY RT-PCR (FLU A&B, COVID) ARPGX2  LIPASE, BLOOD  LACTIC ACID, PLASMA  POC OCCULT BLOOD, ED    EKG None  Radiology CT ABDOMEN PELVIS W CONTRAST  Result Date: 04/01/2021 CLINICAL DATA:  Lower abdominal pain, blood in stool. EXAM: CT ABDOMEN AND PELVIS WITH CONTRAST TECHNIQUE: Multidetector CT imaging of the abdomen and pelvis was performed using the standard protocol following bolus administration of intravenous contrast. RADIATION DOSE REDUCTION: This exam was performed according to the departmental dose-optimization program which includes automated exposure control, adjustment of the mA and/or kV according to patient size and/or use of iterative reconstruction technique. CONTRAST:  128mL OMNIPAQUE IOHEXOL 300 MG/ML  SOLN COMPARISON:  None. FINDINGS: Lower chest: No acute abnormality. Hepatobiliary: No focal hepatic abnormality. Gallbladder unremarkable. Pancreas: No focal abnormality or ductal dilatation. Spleen: No focal abnormality.  Normal size. Adrenals/Urinary Tract: No adrenal abnormality. No focal renal abnormality. No stones or hydronephrosis. Urinary bladder is unremarkable. Stomach/Bowel: There is long segment wall thickening in the sigmoid colon with surrounding inflammation/stranding. A few locules of extraluminal gas are noted posteriorly with a small amount of fluid concerning for micro perforation. Small fluid collection measures up  to 2.5 cm. Given the long segment of involvement and lack of visible diverticula, this is presumably related to colitis. Stomach and small bowel decompressed, unremarkable. Appendix is normal. Vascular/Lymphatic: No evidence of aneurysm or adenopathy. Reproductive: No visible focal abnormality. Other: No free fluid or free air. Musculoskeletal: No acute bony abnormality. IMPRESSION: Long segment wall thickening and inflammation involving the mid sigmoid colon compatible with colitis. Few locules of extraluminal gas and small fluid collection noted posteriorly compatible with micro perforation. These results were called by telephone at the time of interpretation on 04/01/2021 at 5:19 pm to provider Fredia Sorrow, who verbally acknowledged these results. Electronically Signed   By: Rolm Baptise M.D.   On: 04/01/2021 17:25    Procedures Procedures    Medications Ordered in ED Medications  0.9 %  sodium chloride infusion ( Intravenous New Bag/Given 04/01/21 1924)  piperacillin-tazobactam (ZOSYN) IVPB 3.375 g (  has no administration in time range)  acetaminophen (TYLENOL) tablet 1,000 mg (1,000 mg Oral Given 04/01/21 1941)  clonazePAM (KLONOPIN) tablet 0.5 mg (0.5 mg Oral Given 04/01/21 1941)  divalproex (DEPAKOTE) DR tablet 500 mg (has no administration in time range)  haloperidol (HALDOL) tablet 10 mg (has no administration in time range)  HYDROmorphone (DILAUDID) injection 1 mg (1 mg Intravenous Given 04/01/21 1941)  sodium chloride 0.9 % bolus 1,000 mL (0 mLs Intravenous Stopped 04/01/21 1844)  piperacillin-tazobactam (ZOSYN) IVPB 3.375 g (0 g Intravenous Stopped 04/01/21 1617)  fentaNYL (SUBLIMAZE) injection 50 mcg (50 mcg Intravenous Given 04/01/21 1616)  ondansetron (ZOFRAN) injection 4 mg (4 mg Intravenous Given 04/01/21 1613)  iohexol (OMNIPAQUE) 300 MG/ML solution 100 mL (100 mLs Intravenous Contrast Given 04/01/21 1712)  sodium chloride 0.9 % bolus 1,000 mL (0 mLs Intravenous Stopped 04/01/21 1934)   fentaNYL (SUBLIMAZE) injection 50 mcg (50 mcg Intravenous Given 04/01/21 1805)  sodium chloride 0.9 % bolus 1,000 mL (1,000 mLs Intravenous New Bag/Given 04/01/21 1923)    ED Course/ Medical Decision Making/ A&P Clinical Course as of 04/01/21 2019  Mon Apr 01, 2021  1510 Patient respiratory rate increasing, persistently tachycardic, now hypotensive + elevated WBC ? Sepsis?.  I have ordered a bolus of fluid, blood cultures, lactic acid and a dose of Zosyn, CT abdomen pelvis pending.  [AH]  1525 CBC with Differential(!) [AH]  1525 WBC(!): 17.6 + leukocytosis   [AH]  1525 Urinalysis, Routine w reflex microscopic Urine, Clean Catch(!) Negative urine [AH]    Clinical Course User Index [AH] Margarita Mail, PA-C                           Medical Decision Making Amount and/or Complexity of Data Reviewed Labs: ordered. Decision-making details documented in ED Course. Radiology: ordered.  Risk Prescription drug management. Decision regarding hospitalization.   This patient presents to the ED for concern of lower abd pain, this involves an extensive number of treatment options, and is a complaint that carries with it a high risk of complications and morbidity.  The differential diagnosis includes The differential diagnosis for generalized abdominal pain includes, but is not limited to AAA, gastroenteritis, appendicitis, Bowel obstruction, Bowel perforation. Gastroparesis, DKA, Hernia, Inflammatory bowel disease, mesenteric ischemia, pancreatitis, peritonitis SBP, volvulus.    Co morbidities that complicate the patient evaluation  TBI, poor historian   Additional history obtained:  Additional history obtained from mother External records from outside source obtained and reviewed including [place] in [place] seen yesterday   Lab Tests:  I Ordered, and personally interpreted labs.  The pertinent results include: CBC with elevated white blood cell count of 17.6 thousand CMP with  mild hyponatremia of 134 insignificant, urine is negative, respiratory panel and fecal occult negative, no lactic acidosis noted.   Imaging Studies ordered:  I ordered imaging studies including CT abdomen and pelvis with contrast I independently visualized and interpreted imaging which showed inflamed colitis with inflammatory stranding in the pelvis, microperforation without evidence of free air I agree with the radiologist interpretation   Cardiac Monitoring:  The patient was maintained on a cardiac monitor.  I personally viewed and interpreted the cardiac monitored which showed an underlying rhythm of: Sinus tachycardia   Medicines ordered and prescription drug management:  I ordered medication including fentanyl and Zosyn for pain and suspected intra-abdominal infection Reevaluation of the patient after these medicines showed that the patient improved I have reviewed the patients home  medicines and have made adjustments as needed   Test Considered:   Critical Interventions:  Broad-spectrum antibiotic with gram-negative coverage for intra-abdominal infection   Consultations Obtained:  I requested consultation with the Dr. Dyke Maes of general surgery,  and discussed lab and imaging findings as well as pertinent plan - they recommend: Inpatient admission with continued IV antibiotics and serial examinations   Problem List / ED Course:  Abdominal pain-intra-abdominal infection including colitis with microperforation   Reevaluation:  After the interventions noted above, I reevaluated the patient and found that they have :improved   Social Determinants of Health:  Traumatic brain injury, bipolar disorder, strong familial support   Dispostion:  After consideration of the diagnostic results and the patients response to treatment, I feel that the patent would benefit from admission.  Final Clinical Impression(s) / ED Diagnoses Final diagnoses:  Large bowel  perforation [facility])  Colitis    Rx / DC Orders ED Discharge Orders     None         Margarita Mail, PA-C 04/01/21 2019    Milton Ferguson, MD 04/03/21 [phone]

## 2021-04-01 NOTE — H&P (Addendum)
History and Physical    [name] [id number] DOB: 03/09/1986 DOA: 04/01/2021  PCP: Pcp, No   Patient coming from: Home  I have personally briefly reviewed patient's old medical records in [place]  Chief Complaint: Abdominal pain  HPI: [name] is a 36 y.o. male with medical history significant for bipolar disorder, depression,  Traumatic brain injury. Patient was brought to the ED with complaints of lower abdominal pain of 4 days duration and blood in stool.  He also reports rectal pain when he sits down.  Patient is able to answer questions, he denies nausea vomiting or loose stools.  Denies pain with urination.  He describes difficulty breathing.  No cough no fevers no chills.  ED Course: Temperature 99.9.  Tachycardic heart rate 105-139.  Tachypneic, respiratory rate 20-28.  Blood pressure mostly 115-128.  Lactic acid 1.  WBC 17.6.  Abdominal CT-Long segment wall thickening and inflammation involving the mid sigmoid colon compatible with colitis, few mucosal of extraluminal gas and small fluid collection noted posteriorly compatible with microperforation. EDP talked to general surgeon Dr. Okey Dupre, recommended nonoperative management with bowel rest and IV antibiotics at this time, IV Zosyn.   Will see the morning.  Review of Systems: As per HPI all other systems reviewed and negative.  Past Medical History:  Diagnosis Date   Bipolar 1 disorder ([place])    Brain injury     Past Surgical History:  Procedure Laterality Date   ANKLE SURGERY     FRACTURE SURGERY       reports that he has been smoking cigarettes. He has a 4.00 pack-year smoking history. He does not have any smokeless tobacco history on file. He reports current drug use. Drug: Marijuana. He reports that he does not drink alcohol.  No Known Allergies  Family history not pertinent   Prior to Admission medications   Medication Sig Start Date End Date Taking? Authorizing Provider   acetaminophen (TYLENOL) 500 MG tablet Take 1,000 mg by mouth every 6 (six) hours as needed.   Yes [provider]  Atorvastatin Calcium (LIPITOR PO) Take 10 mg by mouth daily. 03/31/21  Yes [provider]  clonazePAM (KLONOPIN) 0.5 MG tablet Take 0.5 mg by mouth daily as needed. 03/30/21  Yes [provider]  divalproex (DEPAKOTE) 500 MG DR tablet Take 500 mg by mouth at bedtime. 03/27/21  Yes [provider]  haloperidol (HALDOL) 10 MG tablet Take 10 mg by mouth See admin instructions. Take 1 and 1/2 tablets every evening 03/15/21  Yes [provider]  carbamazepine (TEGRETOL) 200 MG tablet 200 mg Tablets. 1 tablet in the morning (200mg ) and 2 tablets at hs. (400mg ). Patient not taking: Reported on 04/01/2021 03/19/12   Nena Polio T, PA-C  citalopram (CELEXA) 20 MG tablet Take 1 tablet (20 mg total) by mouth daily. For depression. Patient not taking: Reported on 04/01/2021 03/19/12   Nena Polio T, PA-C  risperiDONE (RISPERDAL) 2 MG tablet Take 1 tablet (2 mg total) by mouth at bedtime. For psychosis. Patient not taking: Reported on 04/01/2021 03/19/12   Pamelia Hoit    Physical Exam: Vitals:   04/01/21 1500 04/01/21 1530 04/01/21 1630 04/01/21 1802  BP: 95/64 117/85 116/84 128/81  Pulse: (!) 113 (!) 114 (!) 117 (!) 117  Resp: (!) 25 (!) 25 (!) 22 (!) 22  Temp:      TempSrc:      SpO2: 100% 98% 98% 97%    Constitutional:  NAD, calm, comfortable Vitals:   04/01/21 1500 04/01/21 1530 04/01/21 1630 04/01/21 1802  BP: 95/64 117/85 116/84 128/81  Pulse: (!) 113 (!) 114 (!) 117 (!) 117  Resp: (!) 25 (!) 25 (!) 22 (!) 22  Temp:      TempSrc:      SpO2: 100% 98% 98% 97%   Eyes: PERRL, lids and conjunctivae normal ENMT: Mucous membranes are dry  Neck: normal, supple, no masses, no thyromegaly Respiratory: clear to auscultation bilaterally, no wheezing, no crackles. Normal respiratory effort. No accessory muscle use.  Cardiovascular:  Regular rate and rhythm, no murmurs / rubs / gallops. No extremity edema. 2+ pedal pulses.  Abdomen: Mild lower abdominal pain, abdomen soft, no tenderness, no masses palpated. No hepatosplenomegaly. Bowel sounds positive.  Musculoskeletal: no clubbing / cyanosis. No joint deformity upper and lower extremities.  Skin: no rashes, lesions, ulcers. No induration Neurologic: No apparent cranial nerve abnormalities, moving extremities spontaneously. Psychiatric: Normal judgment and insight. Alert and oriented x 3. Normal mood.   Labs on Admission: I have personally reviewed following labs and imaging studies  CBC: Recent Labs  Lab 04/01/21 1348  WBC 17.6*  NEUTROABS 13.0*  HGB 15.7  HCT 46.0  MCV 93.7  PLT 0000000   Basic Metabolic Panel: Recent Labs  Lab 04/01/21 1348  NA 134*  K 3.8  CL 98  CO2 25  GLUCOSE 94  BUN 10  CREATININE 0.88  CALCIUM 8.9   GFR: CrCl cannot be calculated (Unknown ideal weight.). Liver Function Tests: Recent Labs  Lab 04/01/21 1348  AST 15  ALT 21  ALKPHOS 59  BILITOT 0.4  PROT 7.5  ALBUMIN 3.7   Recent Labs  Lab 04/01/21 1348  LIPASE 22   Urine analysis:    Component Value Date/Time   COLORURINE YELLOW 04/01/2021 1337   APPEARANCEUR CLEAR 04/01/2021 1337   LABSPEC 1.021 04/01/2021 1337   PHURINE 6.0 04/01/2021 1337   GLUCOSEU NEGATIVE 04/01/2021 1337   HGBUR SMALL (A) 04/01/2021 1337   BILIRUBINUR NEGATIVE 04/01/2021 1337   KETONESUR 5 (A) 04/01/2021 1337   PROTEINUR NEGATIVE 04/01/2021 1337   UROBILINOGEN 0.2 08/09/2008 1812   NITRITE NEGATIVE 04/01/2021 1337   LEUKOCYTESUR NEGATIVE 04/01/2021 1337    Radiological Exams on Admission: CT ABDOMEN PELVIS W CONTRAST  Result Date: 04/01/2021 CLINICAL DATA:  Lower abdominal pain, blood in stool. EXAM: CT ABDOMEN AND PELVIS WITH CONTRAST TECHNIQUE: Multidetector CT imaging of the abdomen and pelvis was performed using the standard protocol following bolus administration of intravenous  contrast. RADIATION DOSE REDUCTION: This exam was performed according to the departmental dose-optimization program which includes automated exposure control, adjustment of the mA and/or kV according to patient size and/or use of iterative reconstruction technique. CONTRAST:  15mL OMNIPAQUE IOHEXOL 300 MG/ML  SOLN COMPARISON:  None. FINDINGS: Lower chest: No acute abnormality. Hepatobiliary: No focal hepatic abnormality. Gallbladder unremarkable. Pancreas: No focal abnormality or ductal dilatation. Spleen: No focal abnormality.  Normal size. Adrenals/Urinary Tract: No adrenal abnormality. No focal renal abnormality. No stones or hydronephrosis. Urinary bladder is unremarkable. Stomach/Bowel: There is long segment wall thickening in the sigmoid colon with surrounding inflammation/stranding. A few locules of extraluminal gas are noted posteriorly with a small amount of fluid concerning for micro perforation. Small fluid collection measures up to 2.5 cm. Given the long segment of involvement and lack of visible diverticula, this is presumably related to colitis. Stomach and small bowel decompressed, unremarkable. Appendix is normal. Vascular/Lymphatic: No evidence of aneurysm or adenopathy. Reproductive:  No visible focal abnormality. Other: No free fluid or free air. Musculoskeletal: No acute bony abnormality. IMPRESSION: Long segment wall thickening and inflammation involving the mid sigmoid colon compatible with colitis. Few locules of extraluminal gas and small fluid collection noted posteriorly compatible with micro perforation. These results were called by telephone at the time of interpretation on 04/01/2021 at 5:19 pm to provider Fredia Sorrow, who verbally acknowledged these results. Electronically Signed   By: Rolm Baptise M.D.   On: 04/01/2021 17:25    EKG: Pending  Assessment/Plan Principal Problem:   Colitis Active Problems:   Sepsis ([place])   Major depressive disorder, recurrent ([facility])   Sepsis  secondary to sigmoid colitis + microperforation-tachycardic 105-139, tachypneic 20-28, leukocytosis of 17.6.  Normal lactic acid 1.  CT abdomen and pelvis showing sigmoid colitis, and microperforation. - EDP talked to Dr. Okey Dupre, general surgeon, conservative management for now, IV Zosyn, bowel rest. - 3 L bolus given, continue D5 N/s + 20 Kcl 100cc/hr x 1 day - EKG -N.p.o. except for medications -Follow-up blood cultures - 1 mg dilaudid q4h prn  Depression, bipolar disorder, intermittent explosive disorder-resume psych meds, he is able to confirm all the meds he takes which include- clonazepam Depakote Haldol. Per patient's mother patient could become quite agitated and psychotic if he does not get his oral medications, this has happened in the past. - Resume psych meds only  Traumatic brain injury  DVT prophylaxis: Heparin Code Status: Full code Family Communication: None at bedside.  Updated patient's mother Bevely Palmer- a nurse on the floor, on the phone. Disposition Plan:  ~ /> 2 days, until resolution of sepsis, treatment of colitis. Consults called: gen surg Admission status: inpt tele I certify that at the point of admission it is my clinical judgment that the patient will require inpatient hospital care spanning beyond 2 midnights from the point of admission due to high intensity of service, high risk for further deterioration and high frequency of surveillance required.    Bethena Roys MD [facility]  04/01/2021, 9:30 PM

## 2021-04-01 NOTE — ED Triage Notes (Signed)
Abdominal pain with blood in stool

## 2021-04-01 NOTE — Progress Notes (Signed)
Called by the ED PA regarding [name].  Patient is a 36 year old male who presented to the ED with a 4-day history of abdominal pain in his bilateral lower quadrants.  He denies nausea, vomiting, and diarrhea, though confirmed an episode of fecal incontinence.  Patient underwent CT of the abdomen and pelvis, which was reviewed by myself, and demonstrated a long segment of sigmoid colitis with a posterior area of likely microperforation with 2 punctate areas of extraluminal air noted, and a small 2.5 cm fluid collection.  Patient's other work-up demonstrated a leukocytosis of 17.6, and a normal lactic acid of 1.0.  Patient's blood pressure has remained stable, but he has been mildly tachycardic in the 110s.  Plan: -Given all of the patient's presenting symptoms, imaging, and blood work, recommend nonoperative management with bowel rest and IV antibiotics at this time -Continue IV Zosyn -Will order an additional 1 L bolus, and start the patient on maintenance IV fluids -IV pain control PRN -NPO -Plan to evaluate the patient first thing in the morning.  Please call with any questions or concerns  Graciella Freer, DO [facility] [place], [place] [id number] [phone] (office)

## 2021-04-01 NOTE — ED Notes (Signed)
Called to give report to get pt moved up, was told by RN that she didn't know that she was getting a pt and would call me back

## 2021-04-01 NOTE — Plan of Care (Signed)

## 2021-04-02 ENCOUNTER — Other Ambulatory Visit: Payer: Self-pay

## 2021-04-02 DIAGNOSIS — K529 Noninfective gastroenteritis and colitis, unspecified: Secondary | ICD-10-CM

## 2021-04-02 DIAGNOSIS — E876 Hypokalemia: Secondary | ICD-10-CM

## 2021-04-02 DIAGNOSIS — F339 Major depressive disorder, recurrent, unspecified: Secondary | ICD-10-CM

## 2021-04-02 DIAGNOSIS — E6609 Other obesity due to excess calories: Secondary | ICD-10-CM

## 2021-04-02 DIAGNOSIS — K631 Perforation of intestine (nontraumatic): Secondary | ICD-10-CM

## 2021-04-02 DIAGNOSIS — Z6835 Body mass index (BMI) 35.0-35.9, adult: Secondary | ICD-10-CM

## 2021-04-02 LAB — BASIC METABOLIC PANEL
Anion gap: 8 (ref 5–15)
BUN: 9 mg/dL (ref 6–20)
CO2: 27 mmol/L (ref 22–32)
Calcium: 8.2 mg/dL — ABNORMAL LOW (ref 8.9–10.3)
Chloride: 101 mmol/L (ref 98–111)
Creatinine, Ser: 0.83 mg/dL (ref 0.61–1.24)
GFR, Estimated: >60 mL/min (ref >60)
Glucose, Bld: 100 mg/dL — ABNORMAL HIGH (ref 70–99)
Potassium: 3.3 mmol/L — ABNORMAL LOW (ref 3.5–5.1)
Sodium: 136 mmol/L (ref 135–145)

## 2021-04-02 LAB — CBC
HCT: 39.8 % (ref 39.0–52.0)
Hemoglobin: 12.7 g/dL — ABNORMAL LOW (ref 13.0–17.0)
MCH: 30 pg (ref 26.0–34.0)
MCHC: 31.9 g/dL (ref 30.0–36.0)
MCV: 94.1 fL (ref 80.0–100.0)
Platelets: 209 10*3/uL (ref 150–400)
RBC: 4.23 MIL/uL (ref 4.22–5.81)
RDW: 13.9 % (ref 11.5–15.5)
WBC: 17.7 10*3/uL — ABNORMAL HIGH (ref 4.0–10.5)
nRBC: 0 % (ref 0.0–0.2)

## 2021-04-02 LAB — MAGNESIUM: Magnesium: 1.7 mg/dL (ref 1.7–2.4)

## 2021-04-02 LAB — HIV ANTIBODY (ROUTINE TESTING W REFLEX): HIV Screen 4th Generation wRfx: NONREACTIVE

## 2021-04-02 MED ORDER — NICOTINE 21 MG/24HR TD PT24
21.0000 mg | MEDICATED_PATCH | Freq: Every day | TRANSDERMAL | Status: DC
  Administered 2021-04-02 – 2021-04-04 (×3): 21 mg via TRANSDERMAL
  Filled 2021-04-02 (×3): qty 1

## 2021-04-02 MED ORDER — POTASSIUM CHLORIDE 10 MEQ/100ML IV SOLN
10.0000 meq | INTRAVENOUS | Status: AC
  Administered 2021-04-02 (×3): 10 meq via INTRAVENOUS
  Filled 2021-04-02 (×3): qty 100

## 2021-04-02 NOTE — Progress Notes (Signed)
Patient is alert and oriented, has tolerated pain well,  call bell at beside.

## 2021-04-02 NOTE — Consult Note (Signed)
[place]  Reason for Consult: Sigmoid colitis with microperforation Referring Physician: Margarita Mail, PA-C  Chief Complaint   Abdominal Pain     HPI: [name] is a 36 y.o. male who presents with a 4-day history of lower abdominal pain.  He denies ever having pain like this in the past.  He states that his pain did not improve, so he reported to the hospital for evaluation.  He denies nausea, vomiting, fever, chills, and diarrhea.  His last bowel movement was 2 days ago, and at that time, he had an episode of fecal incontinence.  He confirms passing flatus today.  He describes the pain as a sharp pain in his bilateral lower quadrants, but worse on the left side.  He denies any history of abdominal surgeries.  He smokes 1 pack/day and has done so for the last 10 years.  He denies use of alcohol and illicit drugs.  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  He denies use of blood thinning medications.  He denies history of colonoscopy.  Patient underwent CT abdomen and pelvis in the ED, which demonstrated long segment of sigmoid colitis with likely microperforation,2 small locules of extraluminal gas posterior to the colon with an associated 2.5 cm fluid collection.  Upon admission, his leukocytosis was 17.6 and it was unchanged at 17.7 this morning.  The patient has been tachycardic ranging from 105-130, however the patient states that he is normally tachycardic at baseline, with heart rates normally in the 110s.  Past Medical History:  Diagnosis Date   Anxiety    Bipolar 1 disorder ([place])    Brain injury     Past Surgical History:  Procedure Laterality Date   ANKLE SURGERY     FRACTURE SURGERY      History reviewed. No pertinent family history.  Social History   Tobacco Use   Smoking status: Every Day    Packs/day: 1.00    Years: 4.00    Pack years: 4.00    Types: Cigarettes   Vaping Use   Vaping Use: Every day  Substance Use Topics   Alcohol use: No   Drug use: Yes    Types: Marijuana    Comment: 3 days ago    Medications: I have reviewed the patient's current medications.  No Known Allergies   ROS:  Constitutional: negative for chills, fatigue, and fevers Respiratory: negative for cough, wheezing, and shortness of breath Cardiovascular: negative for chest pain and palpitations Gastrointestinal: positive for abdominal pain, negative for constipation, diarrhea, nausea, and vomiting  Blood pressure 108/75, pulse (!) 110, temperature 98.1 F (36.7 C), resp. rate 20, height 6' (1.829 m), weight 118.1 kg, SpO2 96 %. Physical Exam Vitals reviewed.  Constitutional:      Appearance: He is well-developed.  Eyes:     Extraocular Movements: Extraocular movements intact.     Pupils: Pupils are equal, round, and reactive to light.  Cardiovascular:     Rate and Rhythm: Regular rhythm. Tachycardia present.  Pulmonary:     Effort: Pulmonary effort is normal.     Breath sounds: Normal breath sounds.  Abdominal:     Comments: Soft, nondistended, mild percussion tenderness in the left lower quadrant, tenderness to palpation in left lower quadrant and right lower quadrant, worse on the left side; no rigidity, guarding, rebound tenderness  Skin:    General: Skin is warm.  Neurological:  General: No focal deficit present.     Mental Status: He is alert and oriented to person, place, and time.  Psychiatric:        Mood and Affect: Mood normal.        Behavior: Behavior normal.    Results: Results for orders placed or performed during the hospital encounter of 04/01/21 (from the past 48 hour(s))  Urinalysis, Routine w reflex microscopic Urine, Clean Catch     Status: Abnormal   Collection Time: 04/01/21  1:37 PM  Result Value Ref Range   Color, Urine YELLOW YELLOW   APPearance CLEAR CLEAR   Specific Gravity, Urine 1.021 1.005 - 1.030   pH 6.0 5.0 - 8.0    Glucose, UA NEGATIVE NEGATIVE mg/dL   Hgb urine dipstick SMALL (A) NEGATIVE   Bilirubin Urine NEGATIVE NEGATIVE   Ketones, ur 5 (A) NEGATIVE mg/dL   Protein, ur NEGATIVE NEGATIVE mg/dL   Nitrite NEGATIVE NEGATIVE   Leukocytes,Ua NEGATIVE NEGATIVE   RBC / HPF 6-10 0 - 5 RBC/hpf   WBC, UA 0-5 0 - 5 WBC/hpf   Bacteria, UA NONE SEEN NONE SEEN   Mucus PRESENT     Comment: Performed at [facility], [address], [place], [place] [address]  CBC with Differential     Status: Abnormal   Collection Time: 04/01/21  1:48 PM  Result Value Ref Range   WBC 17.6 (H) 4.0 - 10.5 K/uL   RBC 4.91 4.22 - 5.81 MIL/uL   Hemoglobin 15.7 13.0 - 17.0 g/dL   HCT 46.0 39.0 - 52.0 %   MCV 93.7 80.0 - 100.0 fL   MCH 32.0 26.0 - 34.0 pg   MCHC 34.1 30.0 - 36.0 g/dL   RDW 13.7 11.5 - 15.5 %   Platelets 261 150 - 400 K/uL   nRBC 0.0 0.0 - 0.2 %   Neutrophils Relative % 74 %   Neutro Abs 13.0 (H) 1.7 - 7.7 K/uL   Lymphocytes Relative 10 %   Lymphs Abs 1.8 0.7 - 4.0 K/uL   Monocytes Relative 15 %   Monocytes Absolute 2.6 (H) 0.1 - 1.0 K/uL   Eosinophils Relative 0 %   Eosinophils Absolute 0.0 0.0 - 0.5 K/uL   Basophils Relative 0 %   Basophils Absolute 0.1 0.0 - 0.1 K/uL   Immature Granulocytes 1 %   Abs Immature Granulocytes 0.09 (H) 0.00 - 0.07 K/uL    Comment: Performed at [facility], [address] [place], [place] [address]  Comprehensive metabolic panel     Status: Abnormal   Collection Time: 04/01/21  1:48 PM  Result Value Ref Range   Sodium 134 (L) 135 - 145 mmol/L   Potassium 3.8 3.5 - 5.1 mmol/L   Chloride 98 98 - 111 mmol/L   CO2 25 22 - 32 mmol/L   Glucose, Bld 94 70 - 99 mg/dL    Comment: Glucose reference range applies only to samples taken after fasting for at least 8 hours.   BUN 10 6 - 20 mg/dL   Creatinine, Ser 0.88 0.61 - 1.24 mg/dL   Calcium 8.9 8.9 - 10.3 mg/dL   Total Protein 7.5 6.5 - 8.1 g/dL   Albumin 3.7 3.5 - 5.0 g/dL   AST 15 15 - 41 U/L   ALT 21 0 - 44 U/L    Alkaline Phosphatase 59 38 - 126 U/L   Total Bilirubin 0.4 0.3 - 1.2 mg/dL   GFR, Estimated >60 >60 mL/min  Comment: (NOTE) Calculated using the CKD-EPI Creatinine Equation (2021)    Anion gap 11 5 - 15    Comment: Performed at [facility], [address], [place], [place] [address]  Lipase, blood     Status: None   Collection Time: 04/01/21  1:48 PM  Result Value Ref Range   Lipase 22 11 - 51 U/L    Comment: Performed at [facility], [address], [place], [place] [address]  POC occult blood, ED Provider will collect     Status: None   Collection Time: 04/01/21  1:53 PM  Result Value Ref Range   Occult Blood, Feces Negative   Resp Panel by RT-PCR (Flu A&B, Covid) Nasopharyngeal Swab     Status: None   Collection Time: 04/01/21  3:20 PM   Specimen: Nasopharyngeal Swab; Nasopharyngeal(NP) swabs in vial transport medium  Result Value Ref Range   SARS Coronavirus 2 by RT PCR NEGATIVE NEGATIVE    Comment: (NOTE) SARS-CoV-2 target nucleic acids are NOT DETECTED.  The SARS-CoV-2 RNA is generally detectable in upper respiratory specimens during the acute phase of infection. The lowest concentration of SARS-CoV-2 viral copies this assay can detect is 138 copies/mL. A negative result does not preclude SARS-Cov-2 infection and should not be used as the sole basis for treatment or other patient management decisions. A negative result may occur with  improper specimen collection/handling, submission of specimen other than nasopharyngeal swab, presence of viral mutation(s) within the areas targeted by this assay, and inadequate number of viral copies(<138 copies/mL). A negative result must be combined with clinical observations, patient history, and epidemiological information. The expected result is Negative.  Fact Sheet for Patients:  EntrepreneurPulse.com.au  Fact Sheet for Healthcare Providers:  IncredibleEmployment.be  This test is no t yet  approved or cleared by the [place] FDA and  has been authorized for detection and/or diagnosis of SARS-CoV-2 by FDA under an Emergency Use Authorization (EUA). This EUA will remain  in effect (meaning this test can be used) for the duration of the COVID-19 declaration under Section 564(b)(1) of the Act, 21 U.S.C.section 360bbb-3(b)(1), unless the authorization is terminated  or revoked sooner.       Influenza A by PCR NEGATIVE NEGATIVE   Influenza B by PCR NEGATIVE NEGATIVE    Comment: (NOTE) The Xpert Xpress SARS-CoV-2/FLU/RSV plus assay is intended as an aid in the diagnosis of influenza from Nasopharyngeal swab specimens and should not be used as a sole basis for treatment. Nasal washings and aspirates are unacceptable for Xpert Xpress SARS-CoV-2/FLU/RSV testing.  Fact Sheet for Patients: EntrepreneurPulse.com.au  Fact Sheet for Healthcare Providers: IncredibleEmployment.be  This test is not yet approved or cleared by the [place] FDA and has been authorized for detection and/or diagnosis of SARS-CoV-2 by FDA under an Emergency Use Authorization (EUA). This EUA will remain in effect (meaning this test can be used) for the duration of the COVID-19 declaration under Section 564(b)(1) of the Act, 21 U.S.C. section 360bbb-3(b)(1), unless the authorization is terminated or revoked.  Performed at [facility], [address] [place], [place] [address]   Lactic acid, plasma     Status: None   Collection Time: 04/01/21  3:31 PM  Result Value Ref Range   Lactic Acid, Venous 1.0 0.5 - 1.9 mmol/L    Comment: Performed at [facility], [address], [place], [place] [address]  Blood culture (routine x 2)     Status: None (Preliminary result)   Collection Time: 04/01/21  3:31 PM   Specimen: Left Antecubital; Blood  Result Value Ref Range   Specimen Description LEFT ANTECUBITAL    Special Requests      BOTTLES DRAWN AEROBIC AND  ANAEROBIC Blood Culture adequate volume   Culture      NO GROWTH < 24 HOURS Performed at [facility], [address] [place], [place] [address]    Report Status PENDING   Blood culture (routine x 2)     Status: None (Preliminary result)   Collection Time: 04/01/21  3:31 PM   Specimen: BLOOD LEFT HAND  Result Value Ref Range   Specimen Description BLOOD LEFT HAND    Special Requests      BOTTLES DRAWN AEROBIC AND ANAEROBIC Blood Culture adequate volume   Culture      NO GROWTH < 24 HOURS Performed at [facility], [address] [place], [place] [address]    Report Status PENDING   HIV Antibody (routine testing w rflx)     Status: None   Collection Time: 04/02/21  4:54 AM  Result Value Ref Range   HIV Screen 4th Generation wRfx Non Reactive Non Reactive    Comment: Performed at [facility], [place] [address], [place], [place] [id number]  Basic metabolic panel     Status: Abnormal   Collection Time: 04/02/21  4:54 AM  Result Value Ref Range   Sodium 136 135 - 145 mmol/L   Potassium 3.3 (L) 3.5 - 5.1 mmol/L   Chloride 101 98 - 111 mmol/L   CO2 27 22 - 32 mmol/L   Glucose, Bld 100 (H) 70 - 99 mg/dL    Comment: Glucose reference range applies only to samples taken after fasting for at least 8 hours.   BUN 9 6 - 20 mg/dL   Creatinine, Ser 0.83 0.61 - 1.24 mg/dL   Calcium 8.2 (L) 8.9 - 10.3 mg/dL   GFR, Estimated >60 >60 mL/min    Comment: (NOTE) Calculated using the CKD-EPI Creatinine Equation (2021)    Anion gap 8 5 - 15    Comment: Performed at [facility], [address], [place], [place] [address]  CBC     Status: Abnormal   Collection Time: 04/02/21  4:54 AM  Result Value Ref Range   WBC 17.7 (H) 4.0 - 10.5 K/uL   RBC 4.23 4.22 - 5.81 MIL/uL   Hemoglobin 12.7 (L) 13.0 - 17.0 g/dL   HCT 39.8 39.0 - 52.0 %   MCV 94.1 80.0 - 100.0 fL   MCH 30.0 26.0 - 34.0 pg   MCHC 31.9 30.0 - 36.0 g/dL   RDW 13.9 11.5 - 15.5 %   Platelets 209 150 - 400 K/uL   nRBC 0.0 0.0 - 0.2  %    Comment: Performed at [facility], [address] [place], [place] [address]  Magnesium     Status: None   Collection Time: 04/02/21  4:54 AM  Result Value Ref Range   Magnesium 1.7 1.7 - 2.4 mg/dL    Comment: Performed at [facility], [address] [place],  [address]    CT ABDOMEN PELVIS W CONTRAST  Result Date: 04/01/2021 CLINICAL DATA:  Lower abdominal pain, blood in stool. EXAM: CT ABDOMEN AND PELVIS WITH CONTRAST TECHNIQUE: Multidetector CT imaging of the abdomen and pelvis was performed using the standard protocol following bolus administration of intravenous contrast. RADIATION DOSE REDUCTION: This exam was performed according to the departmental dose-optimization program which includes automated exposure control, adjustment of  the mA and/or kV according to patient size and/or use of iterative reconstruction technique. CONTRAST:  170mL OMNIPAQUE IOHEXOL 300 MG/ML  SOLN COMPARISON:  None. FINDINGS: Lower chest: No acute abnormality. Hepatobiliary: No focal hepatic abnormality. Gallbladder unremarkable. Pancreas: No focal abnormality or ductal dilatation. Spleen: No focal abnormality.  Normal size. Adrenals/Urinary Tract: No adrenal abnormality. No focal renal abnormality. No stones or hydronephrosis. Urinary bladder is unremarkable. Stomach/Bowel: There is long segment wall thickening in the sigmoid colon with surrounding inflammation/stranding. A few locules of extraluminal gas are noted posteriorly with a small amount of fluid concerning for micro perforation. Small fluid collection measures up to 2.5 cm. Given the long segment of involvement and lack of visible diverticula, this is presumably related to colitis. Stomach and small bowel decompressed, unremarkable. Appendix is normal. Vascular/Lymphatic: No evidence of aneurysm or adenopathy. Reproductive: No visible focal abnormality. Other: No free fluid or free air. Musculoskeletal: No acute bony abnormality. IMPRESSION: Long  segment wall thickening and inflammation involving the mid sigmoid colon compatible with colitis. Few locules of extraluminal gas and small fluid collection noted posteriorly compatible with micro perforation. These results were called by telephone at the time of interpretation on 04/01/2021 at 5:19 pm to provider Fredia Sorrow, who verbally acknowledged these results. Electronically Signed   By: Rolm Baptise M.D.   On: 04/01/2021 17:25     Assessment & Plan:  MARGO NAULA is a 36 y.o. male who was admitted with sigmoid colitis and microperforation with small 2.5 cm fluid collection posterior to the colitis, noted on CT abdomen and pelvis.  Leukocytosis 17.7 today, 17.6 yesterday.  Lactic acid 1.0 yesterday.  -Had a long discussion with the patient about his current disease pathology and about his treatment options.  It was explained that he has this long segment of sigmoid colitis, and we are unsure of the etiology at this time.   -Discussed nonoperative management versus operative management, which would involve sigmoid colectomy with either anastomosis versus ostomy (depending on contamination during the case).  The patient would much prefer to attempt nonoperative management at this time, as he wants to avoid surgery and any potential for an ostomy.  Patient also states that he is feeling significantly better than yesterday.   -When I discussed with him his persistent tachycardia, patient stated that his heart rate is normally elevated in the 110s.  Upon independent chart review, all of the patient's previously documented heart rates have been over 100 -Will continue with nonoperative management at this time.  I did discuss with the patient that if he has worsening abdominal pain or failure of his leukocytosis to improve tomorrow, then he will likely need to undergo operative intervention.  Patient understands and would like to proceed with nonoperative management at this time. -Repeat CBC  tomorrow AM -Continue IV Zosyn -NPO -IV fluids -PRN pain control and antiemetics -Appreciate primary team recommendations  All questions were answered to the satisfaction of the patient and family.  -- Graciella Freer, DO [facility] [address] [place], [place] [address] [phone] (office)

## 2021-04-02 NOTE — Progress Notes (Signed)
PROGRESS NOTE    [name]  VOJ:[id number] DOB: 1985-07-10 DOA: 04/01/2021 PCP: Pcp, No   Chief Complaint  Patient presents with   Abdominal Pain    Brief Narrative:  [name] is a 36 y.o. male with medical history significant for bipolar disorder, depression,  Traumatic brain injury. Patient was brought to the ED with complaints of lower abdominal pain of 4 days duration and blood in stool.  He also reports rectal pain when he sits down.  Patient is able to answer questions, he denies nausea vomiting or loose stools.  Denies pain with urination.  He describes difficulty breathing.  No cough no fevers no chills.   ED Course: Temperature 99.9.  Tachycardic heart rate 105-139.  Tachypneic, respiratory rate 20-28.  Blood pressure mostly 115-128.  Lactic acid 1.  WBC 17.6.  Abdominal CT-Long segment wall thickening and inflammation involving the mid sigmoid colon compatible with colitis, few mucosal of extraluminal gas and small fluid collection noted posteriorly compatible with microperforation.   Assessment & Plan: 1-sigmoid diverticulitis with microperforation -Currently afebrile, no nausea no vomiting. -Patient reported lower quadrant abdominal discomfort. -WBCs elevated. -Continue n.p.o. status except for meds, IV fluid resuscitation and current IV antibiotics. -Continue as needed analgesics. -Continue to follow recommendations by general surgery.  2-sepsis secondary to diverticulitis -Patient met sepsis criteria on admission with tachycardia, elevated WBCs, elevated respiratory rate and identified source of infection on CT abdomen demonstrating sigmoid colitis. -Continue IV fluids and IV antibiotics as mentioned above. -Lactic acid 1.0.  3-Hypokalemia -in the setting of poor oral intake -Will replete and follow electrolytes trend. -Continue telemetry monitoring -Checking magnesium level.  4-depression/bipolar disorder -Continue psychotropic  medication -Counseling reorientation and supportive care. -Mood is a stable at this moment.  5-history of tobacco abuse -Cessation counseling provided -Continue nicotine patch.  6-class II obesity -Body mass index is 35.31 kg/m.  -Calorie diet, portion control and increase physical activity discussed with patient.  DVT prophylaxis: Heparin Code Status: Full code Family Communication: No family at bedside. Disposition:   Status is: Remains inpatient secondary to sepsis due to sigmoid colitis with microperforation; patient requiring IV antibiotics.   Consultants:  General surgery  Procedures:  See below for x-ray reports.  Antimicrobials:  Zosyn   Subjective: Afebrile, reported improvement in his left lower quadrant abdominal pain.  No nausea or vomiting.  Patient denies chest pain and shortness of breath.  Objective: Vitals:   04/01/21 2122 04/01/21 2148 04/02/21 0359 04/02/21 0411  BP: 112/78 113/76  108/75  Pulse: (!) 105 (!) 109  (!) 110  Resp: 20 20  20   Temp: 98.8 F (37.1 C) 99.3 F (37.4 C)  98.1 F (36.7 C)  TempSrc: Oral Oral    SpO2: 94% 95%  96%  Weight: 118.1 kg     Height:   6' (1.829 m)     Intake/Output Summary (Last 24 hours) at 04/02/2021 0806 Last data filed at 04/02/2021 0300 Gross per 24 hour  Intake 2123.09 ml  Output --  Net 2123.09 ml   Filed Weights   04/01/21 2122  Weight: 118.1 kg    Examination:  General exam: Appears calm and comfortable, no fever, reports no nausea or vomiting. Respiratory system: Clear to auscultation. Respiratory effort normal.  Good saturation on room air. Cardiovascular system: S1 & S2 heard, sinus tachycardia, no rubs, no gallops, no murmurs, no JVD. Gastrointestinal system: Abdomen is obese, soft, no guarding, reporting tenderness to palpation in his left lower quadrant.  Positive bowel sounds.  Central nervous system: No focal neurological deficits. Extremities: No cyanosis or clubbing. Skin: No  petechiae. Psychiatry: Patient denies suicidal ideation or hallucination.  Stable mood currently.   Data Reviewed: I have personally reviewed following labs and imaging studies  CBC: Recent Labs  Lab 04/01/21 1348 04/02/21 0454  WBC 17.6* 17.7*  NEUTROABS 13.0*  --   HGB 15.7 12.7*  HCT 46.0 39.8  MCV 93.7 94.1  PLT 261 277    Basic Metabolic Panel: Recent Labs  Lab 04/01/21 1348 04/02/21 0454  NA 134* 136  K 3.8 3.3*  CL 98 101  CO2 25 27  GLUCOSE 94 100*  BUN 10 9  CREATININE 0.88 0.83  CALCIUM 8.9 8.2*    GFR: Estimated Creatinine Clearance: 167.9 mL/min (by C-G formula based on SCr of 0.83 mg/dL).  Liver Function Tests: Recent Labs  Lab 04/01/21 1348  AST 15  ALT 21  ALKPHOS 59  BILITOT 0.4  PROT 7.5  ALBUMIN 3.7    CBG: No results for input(s): GLUCAP in the last 168 hours.   Recent Results (from the past 240 hour(s))  Resp Panel by RT-PCR (Flu A&B, Covid) Nasopharyngeal Swab     Status: None   Collection Time: 04/01/21  3:20 PM   Specimen: Nasopharyngeal Swab; Nasopharyngeal(NP) swabs in vial transport medium  Result Value Ref Range Status   SARS Coronavirus 2 by RT PCR NEGATIVE NEGATIVE Final    Comment: (NOTE) SARS-CoV-2 target nucleic acids are NOT DETECTED.  The SARS-CoV-2 RNA is generally detectable in upper respiratory specimens during the acute phase of infection. The lowest concentration of SARS-CoV-2 viral copies this assay can detect is 138 copies/mL. A negative result does not preclude SARS-Cov-2 infection and should not be used as the sole basis for treatment or other patient management decisions. A negative result may occur with  improper specimen collection/handling, submission of specimen other than nasopharyngeal swab, presence of viral mutation(s) within the areas targeted by this assay, and inadequate number of viral copies(<138 copies/mL). A negative result must be combined with clinical observations, patient history,  and epidemiological information. The expected result is Negative.  Fact Sheet for Patients:  EntrepreneurPulse.com.au  Fact Sheet for Healthcare Providers:  IncredibleEmployment.be  This test is no t yet approved or cleared by the [place] FDA and  has been authorized for detection and/or diagnosis of SARS-CoV-2 by FDA under an Emergency Use Authorization (EUA). This EUA will remain  in effect (meaning this test can be used) for the duration of the COVID-19 declaration under Section 564(b)(1) of the Act, 21 U.S.C.section 360bbb-3(b)(1), unless the authorization is terminated  or revoked sooner.       Influenza A by PCR NEGATIVE NEGATIVE Final   Influenza B by PCR NEGATIVE NEGATIVE Final    Comment: (NOTE) The Xpert Xpress SARS-CoV-2/FLU/RSV plus assay is intended as an aid in the diagnosis of influenza from Nasopharyngeal swab specimens and should not be used as a sole basis for treatment. Nasal washings and aspirates are unacceptable for Xpert Xpress SARS-CoV-2/FLU/RSV testing.  Fact Sheet for Patients: EntrepreneurPulse.com.au  Fact Sheet for Healthcare Providers: IncredibleEmployment.be  This test is not yet approved or cleared by the [place] FDA and has been authorized for detection and/or diagnosis of SARS-CoV-2 by FDA under an Emergency Use Authorization (EUA). This EUA will remain in effect (meaning this test can be used) for the duration of the COVID-19 declaration under Section 564(b)(1) of the Act, 21 U.S.C. section 360bbb-3(b)(1), unless  the authorization is terminated or revoked.  Performed at [facility], [address], [place], [place] [address]   Blood culture (routine x 2)     Status: None (Preliminary result)   Collection Time: 04/01/21  3:31 PM   Specimen: Blood  Result Value Ref Range Status   Specimen Description LEFT ANTECUBITAL  Final   Special Requests   Final     BOTTLES DRAWN AEROBIC AND ANAEROBIC Blood Culture adequate volume Performed at [facility], [address], [place], [place] [address]    Culture PENDING  Incomplete   Report Status PENDING  Incomplete  Blood culture (routine x 2)     Status: None (Preliminary result)   Collection Time: 04/01/21  3:31 PM   Specimen: Blood  Result Value Ref Range Status   Specimen Description BLOOD LEFT HAND  Final   Special Requests   Final    BOTTLES DRAWN AEROBIC AND ANAEROBIC Blood Culture adequate volume Performed at [facility], [address], [place], [place] [address]    Culture PENDING  Incomplete   Report Status PENDING  Incomplete    Radiology Studies: CT ABDOMEN PELVIS W CONTRAST  Result Date: 04/01/2021 CLINICAL DATA:  Lower abdominal pain, blood in stool. EXAM: CT ABDOMEN AND PELVIS WITH CONTRAST TECHNIQUE: Multidetector CT imaging of the abdomen and pelvis was performed using the standard protocol following bolus administration of intravenous contrast. RADIATION DOSE REDUCTION: This exam was performed according to the departmental dose-optimization program which includes automated exposure control, adjustment of the mA and/or kV according to patient size and/or use of iterative reconstruction technique. CONTRAST:  130m OMNIPAQUE IOHEXOL 300 MG/ML  SOLN COMPARISON:  None. FINDINGS: Lower chest: No acute abnormality. Hepatobiliary: No focal hepatic abnormality. Gallbladder unremarkable. Pancreas: No focal abnormality or ductal dilatation. Spleen: No focal abnormality.  Normal size. Adrenals/Urinary Tract: No adrenal abnormality. No focal renal abnormality. No stones or hydronephrosis. Urinary bladder is unremarkable. Stomach/Bowel: There is long segment wall thickening in the sigmoid colon with surrounding inflammation/stranding. A few locules of extraluminal gas are noted posteriorly with a small amount of fluid concerning for micro perforation. Small fluid collection measures up to 2.5 cm. Given  the long segment of involvement and lack of visible diverticula, this is presumably related to colitis. Stomach and small bowel decompressed, unremarkable. Appendix is normal. Vascular/Lymphatic: No evidence of aneurysm or adenopathy. Reproductive: No visible focal abnormality. Other: No free fluid or free air. Musculoskeletal: No acute bony abnormality. IMPRESSION: Long segment wall thickening and inflammation involving the mid sigmoid colon compatible with colitis. Few locules of extraluminal gas and small fluid collection noted posteriorly compatible with micro perforation. These results were called by telephone at the time of interpretation on 04/01/2021 at 5:19 pm to provider SFredia Sorrow who verbally acknowledged these results. Electronically Signed   By: KRolm BaptiseM.D.   On: 04/01/2021 17:25    Scheduled Meds:  divalproex  500 mg Oral QHS   haloperidol  10 mg Oral QHS   heparin  5,000 Units Subcutaneous Q8H   nicotine  21 mg Transdermal QHS   Continuous Infusions:  dextrose 5 % and 0.9 % NaCl with KCl 20 mEq/L 100 mL/hr at 04/01/21 2232   piperacillin-tazobactam 3.375 g (04/02/21 0501)   potassium chloride       LOS: 1 day    CBarton Dubois MD [facility]   To contact the attending provider between 7A-7P or the covering provider during after hours 7P-7A, please log into the web site [email or web address]  and access using universal [place] password for that web site. If you do not have the password, please call the hospital operator.  04/02/2021, 8:06 AM

## 2021-04-02 NOTE — TOC Progression Note (Signed)
°  Transition of Care [facility]) Screening Note   Patient Details  Name: [name] Date of Birth: 22-Jul-1985   Transition of [facility]) CM/SW Contact:    Elliot Gault, LCSW Phone Number: 04/02/2021, 10:22 AM    Transition of Care Department [facility]) has reviewed patient and no TOC needs have been identified at this time. We will continue to monitor patient advancement through interdisciplinary progression rounds. If new patient transition needs arise, please place a TOC consult.

## 2021-04-03 DIAGNOSIS — K5720 Diverticulitis of large intestine with perforation and abscess without bleeding: Secondary | ICD-10-CM

## 2021-04-03 DIAGNOSIS — A419 Sepsis, unspecified organism: Secondary | ICD-10-CM

## 2021-04-03 LAB — CBC
HCT: 41.2 % (ref 39.0–52.0)
Hemoglobin: 13.5 g/dL (ref 13.0–17.0)
MCH: 31.6 pg (ref 26.0–34.0)
MCHC: 32.8 g/dL (ref 30.0–36.0)
MCV: 96.5 fL (ref 80.0–100.0)
Platelets: 253 10*3/uL (ref 150–400)
RBC: 4.27 MIL/uL (ref 4.22–5.81)
RDW: 13.7 % (ref 11.5–15.5)
WBC: 13.2 10*3/uL — ABNORMAL HIGH (ref 4.0–10.5)
nRBC: 0 % (ref 0.0–0.2)

## 2021-04-03 LAB — URINALYSIS, COMPLETE (UACMP) WITH MICROSCOPIC
Bilirubin Urine: NEGATIVE
Glucose, UA: NEGATIVE mg/dL
Ketones, ur: NEGATIVE mg/dL
Leukocytes,Ua: NEGATIVE
Nitrite: NEGATIVE
Protein, ur: NEGATIVE mg/dL
Specific Gravity, Urine: 1.002 — ABNORMAL LOW (ref 1.005–1.030)
pH: 8 (ref 5.0–8.0)

## 2021-04-03 LAB — BASIC METABOLIC PANEL
Anion gap: 9 (ref 5–15)
BUN: 9 mg/dL (ref 6–20)
CO2: 28 mmol/L (ref 22–32)
Calcium: 8.3 mg/dL — ABNORMAL LOW (ref 8.9–10.3)
Chloride: 97 mmol/L — ABNORMAL LOW (ref 98–111)
Creatinine, Ser: 0.76 mg/dL (ref 0.61–1.24)
GFR, Estimated: >60 mL/min (ref >60)
Glucose, Bld: 89 mg/dL (ref 70–99)
Potassium: 3.6 mmol/L (ref 3.5–5.1)
Sodium: 134 mmol/L — ABNORMAL LOW (ref 135–145)

## 2021-04-03 LAB — RAPID URINE DRUG SCREEN, HOSP PERFORMED
Amphetamines: NOT DETECTED
Barbiturates: NOT DETECTED
Benzodiazepines: NOT DETECTED
Cocaine: NOT DETECTED
Opiates: NOT DETECTED
Tetrahydrocannabinol: NOT DETECTED

## 2021-04-03 MED ORDER — OXYCODONE HCL 5 MG PO TABS
5.0000 mg | ORAL_TABLET | ORAL | Status: DC | PRN
  Administered 2021-04-03 – 2021-04-05 (×7): 5 mg via ORAL
  Filled 2021-04-03 (×7): qty 1

## 2021-04-03 MED ORDER — ACETAMINOPHEN 500 MG PO TABS
1000.0000 mg | ORAL_TABLET | Freq: Four times a day (QID) | ORAL | Status: DC
  Administered 2021-04-03 – 2021-04-05 (×9): 1000 mg via ORAL
  Filled 2021-04-03 (×9): qty 2

## 2021-04-03 MED ORDER — LACTATED RINGERS IV SOLN: INTRAVENOUS | Status: DC

## 2021-04-03 MED ORDER — LACTATED RINGERS IV BOLUS
1000.0000 mL | Freq: Once | INTRAVENOUS | Status: AC
  Administered 2021-04-03: 18:00:00 1000 mL via INTRAVENOUS

## 2021-04-03 NOTE — Progress Notes (Signed)
Patient has been stable during shift.  Has required pain medications as needed. Patient states he feels hungry and is looking forward to eating.  Patient has voided several times during shift.

## 2021-04-03 NOTE — Progress Notes (Signed)
[facility] Progress Note     Subjective: Patient seen and examined.  He is resting comfortably in bed.  He states that he feels significantly better, and that his abdominal pain is improved.  He is hungry at this time and would like something to eat.  He denies fever, chills, chest pain, shortness of breath, nausea, and vomiting.  He has been passing flatus but denies any bowel movements since admission.  Objective: Vital signs in last 24 hours: Temp:  [98.6 F (37 C)-99.4 F (37.4 C)] 98.6 F (37 C) (01/25 0456) Pulse Rate:  [89-110] 110 (01/25 0456) Resp:  [18-20] 18 (01/25 0456) BP: (100-115)/(55-65) 115/65 (01/25 0456) SpO2:  [94 %-99 %] 98 % (01/25 0456) Last BM Date: 04/01/21  Intake/Output from previous day: 01/24 0701 - 01/25 0700 In: 530.4 [I.V.:202.6; IV Piggyback:327.9] Out: -  Intake/Output this shift: No intake/output data recorded.  General appearance: alert, cooperative, and no distress GI: Soft, nondistended, no percussion tenderness, mild tenderness to palpation in the left lower quadrant, no rigidity, guarding, rebound tenderness  Lab Results:  Recent Labs    04/02/21 0454 04/03/21 0450  WBC 17.7* 13.2*  HGB 12.7* 13.5  HCT 39.8 41.2  PLT 209 253   BMET Recent Labs    04/02/21 0454 04/03/21 0450  NA 136 134*  K 3.3* 3.6  CL 101 97*  CO2 27 28  GLUCOSE 100* 89  BUN 9 9  CREATININE 0.83 0.76  CALCIUM 8.2* 8.3*   PT/INR No results for input(s): LABPROT, INR in the last 72 hours.  Studies/Results: CT ABDOMEN PELVIS W CONTRAST  Result Date: 04/01/2021 CLINICAL DATA:  Lower abdominal pain, blood in stool. EXAM: CT ABDOMEN AND PELVIS WITH CONTRAST TECHNIQUE: Multidetector CT imaging of the abdomen and pelvis was performed using the standard protocol following bolus administration of intravenous contrast. RADIATION DOSE REDUCTION: This exam was performed according to the departmental dose-optimization program which includes  automated exposure control, adjustment of the mA and/or kV according to patient size and/or use of iterative reconstruction technique. CONTRAST:  150mL OMNIPAQUE IOHEXOL 300 MG/ML  SOLN COMPARISON:  None. FINDINGS: Lower chest: No acute abnormality. Hepatobiliary: No focal hepatic abnormality. Gallbladder unremarkable. Pancreas: No focal abnormality or ductal dilatation. Spleen: No focal abnormality.  Normal size. Adrenals/Urinary Tract: No adrenal abnormality. No focal renal abnormality. No stones or hydronephrosis. Urinary bladder is unremarkable. Stomach/Bowel: There is long segment wall thickening in the sigmoid colon with surrounding inflammation/stranding. A few locules of extraluminal gas are noted posteriorly with a small amount of fluid concerning for micro perforation. Small fluid collection measures up to 2.5 cm. Given the long segment of involvement and lack of visible diverticula, this is presumably related to colitis. Stomach and small bowel decompressed, unremarkable. Appendix is normal. Vascular/Lymphatic: No evidence of aneurysm or adenopathy. Reproductive: No visible focal abnormality. Other: No free fluid or free air. Musculoskeletal: No acute bony abnormality. IMPRESSION: Long segment wall thickening and inflammation involving the mid sigmoid colon compatible with colitis. Few locules of extraluminal gas and small fluid collection noted posteriorly compatible with micro perforation. These results were called by telephone at the time of interpretation on 04/01/2021 at 5:19 pm to provider Fredia Sorrow, who verbally acknowledged these results. Electronically Signed   By: Rolm Baptise M.D.   On: 04/01/2021 17:25    Anti-infectives: Anti-infectives (From admission, onward)    Start     Dose/Rate Route Frequency Ordered Stop   04/01/21 2200  piperacillin-tazobactam (ZOSYN) IVPB 3.375 g  3.375 g 12.5 mL/hr over 240 Minutes Intravenous Every 8 hours 04/01/21 1833     04/01/21 1515   piperacillin-tazobactam (ZOSYN) IVPB 3.375 g        3.375 g 100 mL/hr over 30 Minutes Intravenous  Once 04/01/21 1510 04/01/21 1617       Assessment/Plan: [name] is a 36 year old male who was admitted with sigmoid colitis and microperforation with small 2.5 cm fluid collection posterior to the colitis, noted on CT abdomen pelvis.  -Patient feels better today with improving abdominal pain and is feeling hungry -Leukocytosis improved to 13.2 from 17.7 -Patient has again remained persistently tachycardic ranging from 105-120, but from previous chart review, patient appears to be tachycardic at baseline.  Will continue to monitor -Will order CLD -Discussed with patient to take his diet slow, as he did have a long segment of inflammation noted on his CT scan.  If he begins to experience nausea, then he needs to back off on the amount of fluids he is taking in -Continue IV Zosyn -Continue IV fluids -Ordered Roxicodone PRN to decrease amount of IV pain medication he is receiving -Scheduled Tylenol ordered -Will evaluate patient again tomorrow, and make a decision about whether he requires repeat CT abdomen and pelvis to evaluate for enlarging fluid collection -Appreciate primary team recommendations   LOS: 2 days    Ernie Sagrero A Estes Lehner 04/03/2021

## 2021-04-03 NOTE — Progress Notes (Addendum)
PROGRESS NOTE  [name] HFW:[id number] DOB: Jun 29, 1985 DOA: 04/01/2021 PCP: Pcp, No  Brief History:  36 y.o. male with medical history significant for bipolar disorder, depression,  Traumatic brain injury. Patient was brought to the ED with complaints of lower abdominal pain of 4 days duration and blood in stool.  He also reports rectal pain when he sits down.  Patient is able to answer questions, he denies nausea vomiting or loose stools.  Denies pain with urination.  He describes difficulty breathing.  No cough no fevers no chills.   ED Course: Temperature 99.9.  Tachycardic heart rate 105-139.  Tachypneic, respiratory rate 20-28.  Blood pressure mostly 115-128.  Lactic acid 1.  WBC 17.6.  Abdominal CT-Long segment wall thickening and inflammation involving the mid sigmoid colon compatible with colitis, few mucosal of extraluminal gas and small fluid collection noted posteriorly compatible with microperforation.    Assessment/Plan: sigmoid diverticulitis with microperforation -Patient reported lower quadrant abdominal discomfort. -WBC trending down -continue IVF -continue IV zosyn -Continue opioids -appreciate general surgery -start clears -passing flatus, no BM   sepsis  -secondary to diverticulitis -Patient met sepsis criteria on admission with tachycardia, elevated WBCs, elevated respiratory rate and identified source of infection on CT abdomen demonstrating sigmoid colitis. -Continue IV fluids and IV antibiotics as mentioned above. -Lactic acid 1.0.   Hypokalemia -in the setting of poor oral intake -replete -Checking magnesium level.  Sinus tachycardia -essentially asymptomatic -obtain EKG -TSH -Free T4 -bolus fluid and restart maintanence -UDS   depression/bipolar disorder -Continue psychotropic medication -Counseling reorientation and supportive care. -Mood is a stable at this moment.   tobacco abuse -Cessation counseling provided -Continue  nicotine patch.   class II obesity -Body mass index is 35.31 kg/m.  -Calorie diet, portion control and increase physical activity discussed with patient.       Family Communication:   no Family at bedside  Consultants:  general surgery  Code Status:  FULL   DVT Prophylaxis:  [place] Heparin / [place] Lovenox   Procedures: As Listed in Progress Note Above  Antibiotics: Zosyn 1/23>>      Subjective: Patient states abd pain is improving.  Passing flatus, but no BM.  Denies f/c, cp, sob, n/v, hematochezia  Objective: Vitals:   04/02/21 1213 04/02/21 2105 04/03/21 0456 04/03/21 1505  BP: 100/60 (!) 103/55 115/65 118/79  Pulse: 89 (!) 109 (!) 110 99  Resp: 18 20 18 17   Temp: 99.1 F (37.3 C) 99.4 F (37.4 C) 98.6 F (37 C) 98.7 F (37.1 C)  TempSrc: Oral   Oral  SpO2: 94% 99% 98% 91%  Weight:      Height:        Intake/Output Summary (Last 24 hours) at 04/03/2021 1732 Last data filed at 04/03/2021 1438 Gross per 24 hour  Intake 106 ml  Output 550 ml  Net -444 ml   Weight change:  Exam:  General:  Pt is alert, follows commands appropriately, not in acute distress HEENT: No icterus, No thrush, No neck mass, [place]/AT Cardiovascular: RRR, S1/S2, no rubs, no gallops Respiratory: diminished BS but CTA bilaterally, no wheezing, no crackles, no rhonchi Abdomen: Soft/+BS, LLQ tender, non distended, no guarding Extremities: No edema, No lymphangitis, No petechiae, No rashes, no synovitis   Data Reviewed: I have personally reviewed following labs and imaging studies Basic Metabolic Panel: Recent Labs  Lab 04/01/21 1348 04/02/21 0454 04/03/21 0450  NA 134* 136 134*  K  3.8 3.3* 3.6  CL 98 101 97*  CO2 25 27 28   GLUCOSE 94 100* 89  BUN 10 9 9   CREATININE 0.88 0.83 0.76  CALCIUM 8.9 8.2* 8.3*  MG  --  1.7  --    Liver Function Tests: Recent Labs  Lab 04/01/21 1348  AST 15  ALT 21  ALKPHOS 59  BILITOT 0.4  PROT 7.5  ALBUMIN 3.7   Recent Labs  Lab  04/01/21 1348  LIPASE 22   No results for input(s): AMMONIA in the last 168 hours. Coagulation Profile: No results for input(s): INR, PROTIME in the last 168 hours. CBC: Recent Labs  Lab 04/01/21 1348 04/02/21 0454 04/03/21 0450  WBC 17.6* 17.7* 13.2*  NEUTROABS 13.0*  --   --   HGB 15.7 12.7* 13.5  HCT 46.0 39.8 41.2  MCV 93.7 94.1 96.5  PLT 261 209 253   Cardiac Enzymes: No results for input(s): CKTOTAL, CKMB, CKMBINDEX, TROPONINI in the last 168 hours. BNP: Invalid input(s): POCBNP CBG: No results for input(s): GLUCAP in the last 168 hours. HbA1C: No results for input(s): HGBA1C in the last 72 hours. Urine analysis:    Component Value Date/Time   COLORURINE YELLOW 04/01/2021 1337   APPEARANCEUR CLEAR 04/01/2021 1337   LABSPEC 1.021 04/01/2021 1337   PHURINE 6.0 04/01/2021 1337   GLUCOSEU NEGATIVE 04/01/2021 1337   HGBUR SMALL (A) 04/01/2021 1337   BILIRUBINUR NEGATIVE 04/01/2021 1337   KETONESUR 5 (A) 04/01/2021 1337   PROTEINUR NEGATIVE 04/01/2021 1337   UROBILINOGEN 0.2 08/09/2008 1812   NITRITE NEGATIVE 04/01/2021 1337   LEUKOCYTESUR NEGATIVE 04/01/2021 1337   Sepsis Labs: @LABRCNTIP (procalcitonin:4,lacticidven:4) ) Recent Results (from the past 240 hour(s))  Resp Panel by RT-PCR (Flu A&B, Covid) Nasopharyngeal Swab     Status: None   Collection Time: 04/01/21  3:20 PM   Specimen: Nasopharyngeal Swab; Nasopharyngeal(NP) swabs in vial transport medium  Result Value Ref Range Status   SARS Coronavirus 2 by RT PCR NEGATIVE NEGATIVE Final    Comment: (NOTE) SARS-CoV-2 target nucleic acids are NOT DETECTED.  The SARS-CoV-2 RNA is generally detectable in upper respiratory specimens during the acute phase of infection. The lowest concentration of SARS-CoV-2 viral copies this assay can detect is 138 copies/mL. A negative result does not preclude SARS-Cov-2 infection and should not be used as the sole basis for treatment or other patient management decisions.  A negative result may occur with  improper specimen collection/handling, submission of specimen other than nasopharyngeal swab, presence of viral mutation(s) within the areas targeted by this assay, and inadequate number of viral copies(<138 copies/mL). A negative result must be combined with clinical observations, patient history, and epidemiological information. The expected result is Negative.  Fact Sheet for Patients:  EntrepreneurPulse.com.au  Fact Sheet for Healthcare Providers:  IncredibleEmployment.be  This test is no t yet approved or cleared by the [place] FDA and  has been authorized for detection and/or diagnosis of SARS-CoV-2 by FDA under an Emergency Use Authorization (EUA). This EUA will remain  in effect (meaning this test can be used) for the duration of the COVID-19 declaration under Section 564(b)(1) of the Act, 21 U.S.C.section 360bbb-3(b)(1), unless the authorization is terminated  or revoked sooner.       Influenza A by PCR NEGATIVE NEGATIVE Final   Influenza B by PCR NEGATIVE NEGATIVE Final    Comment: (NOTE) The Xpert Xpress SARS-CoV-2/FLU/RSV plus assay is intended as an aid in the diagnosis of influenza from Nasopharyngeal swab specimens and  should not be used as a sole basis for treatment. Nasal washings and aspirates are unacceptable for Xpert Xpress SARS-CoV-2/FLU/RSV testing.  Fact Sheet for Patients: EntrepreneurPulse.com.au  Fact Sheet for Healthcare Providers: IncredibleEmployment.be  This test is not yet approved or cleared by the [place] FDA and has been authorized for detection and/or diagnosis of SARS-CoV-2 by FDA under an Emergency Use Authorization (EUA). This EUA will remain in effect (meaning this test can be used) for the duration of the COVID-19 declaration under Section 564(b)(1) of the Act, 21 U.S.C. section 360bbb-3(b)(1), unless the authorization  is terminated or revoked.  Performed at [facility], [address], [place], [place] [address]   Blood culture (routine x 2)     Status: None (Preliminary result)   Collection Time: 04/01/21  3:31 PM   Specimen: Left Antecubital; Blood  Result Value Ref Range Status   Specimen Description LEFT ANTECUBITAL  Final   Special Requests   Final    BOTTLES DRAWN AEROBIC AND ANAEROBIC Blood Culture adequate volume   Culture   Final    NO GROWTH 2 DAYS Performed at [facility], [address], [place], [place] [address]    Report Status PENDING  Incomplete  Blood culture (routine x 2)     Status: None (Preliminary result)   Collection Time: 04/01/21  3:31 PM   Specimen: BLOOD LEFT HAND  Result Value Ref Range Status   Specimen Description BLOOD LEFT HAND  Final   Special Requests   Final    BOTTLES DRAWN AEROBIC AND ANAEROBIC Blood Culture adequate volume   Culture   Final    NO GROWTH 2 DAYS Performed at [facility], [address] [place], [place] [address]    Report Status PENDING  Incomplete     Scheduled Meds:  acetaminophen  1,000 mg Oral Q6H   divalproex  500 mg Oral QHS   haloperidol  10 mg Oral QHS   heparin  5,000 Units Subcutaneous Q8H   nicotine  21 mg Transdermal QHS   Continuous Infusions:  piperacillin-tazobactam 3.375 g (04/03/21 1438)    Procedures/Studies: CT ABDOMEN PELVIS W CONTRAST  Result Date: 04/01/2021 CLINICAL DATA:  Lower abdominal pain, blood in stool. EXAM: CT ABDOMEN AND PELVIS WITH CONTRAST TECHNIQUE: Multidetector CT imaging of the abdomen and pelvis was performed using the standard protocol following bolus administration of intravenous contrast. RADIATION DOSE REDUCTION: This exam was performed according to the departmental dose-optimization program which includes automated exposure control, adjustment of the mA and/or kV according to patient size and/or use of iterative reconstruction technique. CONTRAST:  17m OMNIPAQUE IOHEXOL 300 MG/ML  SOLN  COMPARISON:  None. FINDINGS: Lower chest: No acute abnormality. Hepatobiliary: No focal hepatic abnormality. Gallbladder unremarkable. Pancreas: No focal abnormality or ductal dilatation. Spleen: No focal abnormality.  Normal size. Adrenals/Urinary Tract: No adrenal abnormality. No focal renal abnormality. No stones or hydronephrosis. Urinary bladder is unremarkable. Stomach/Bowel: There is long segment wall thickening in the sigmoid colon with surrounding inflammation/stranding. A few locules of extraluminal gas are noted posteriorly with a small amount of fluid concerning for micro perforation. Small fluid collection measures up to 2.5 cm. Given the long segment of involvement and lack of visible diverticula, this is presumably related to colitis. Stomach and small bowel decompressed, unremarkable. Appendix is normal. Vascular/Lymphatic: No evidence of aneurysm or adenopathy. Reproductive: No visible focal abnormality. Other: No free fluid or free air. Musculoskeletal: No acute bony abnormality. IMPRESSION: Long segment wall thickening and inflammation involving the mid  sigmoid colon compatible with colitis. Few locules of extraluminal gas and small fluid collection noted posteriorly compatible with micro perforation. These results were called by telephone at the time of interpretation on 04/01/2021 at 5:19 pm to provider Fredia Sorrow, who verbally acknowledged these results. Electronically Signed   By: Rolm Baptise M.D.   On: 04/01/2021 17:25    Orson Eva, DO  [facility]  If 7PM-7AM, please contact night-coverage [email or web address] Password [facility] 04/03/2021, 5:32 PM   LOS: 2 days

## 2021-04-04 LAB — COMPREHENSIVE METABOLIC PANEL
ALT: 12 U/L (ref 0–44)
AST: 11 U/L — ABNORMAL LOW (ref 15–41)
Albumin: 2.8 g/dL — ABNORMAL LOW (ref 3.5–5.0)
Alkaline Phosphatase: 45 U/L (ref 38–126)
Anion gap: 9 (ref 5–15)
BUN: 5 mg/dL — ABNORMAL LOW (ref 6–20)
CO2: 28 mmol/L (ref 22–32)
Calcium: 8.5 mg/dL — ABNORMAL LOW (ref 8.9–10.3)
Chloride: 102 mmol/L (ref 98–111)
Creatinine, Ser: 0.67 mg/dL (ref 0.61–1.24)
GFR, Estimated: >60 mL/min (ref >60)
Glucose, Bld: 95 mg/dL (ref 70–99)
Potassium: 3.5 mmol/L (ref 3.5–5.1)
Sodium: 139 mmol/L (ref 135–145)
Total Bilirubin: 0.5 mg/dL (ref 0.3–1.2)
Total Protein: 6.3 g/dL — ABNORMAL LOW (ref 6.5–8.1)

## 2021-04-04 LAB — CBC
HCT: 38.5 % — ABNORMAL LOW (ref 39.0–52.0)
Hemoglobin: 12.9 g/dL — ABNORMAL LOW (ref 13.0–17.0)
MCH: 31.5 pg (ref 26.0–34.0)
MCHC: 33.5 g/dL (ref 30.0–36.0)
MCV: 94.1 fL (ref 80.0–100.0)
Platelets: 264 10*3/uL (ref 150–400)
RBC: 4.09 MIL/uL — ABNORMAL LOW (ref 4.22–5.81)
RDW: 13.3 % (ref 11.5–15.5)
WBC: 9.7 10*3/uL (ref 4.0–10.5)
nRBC: 0 % (ref 0.0–0.2)

## 2021-04-04 LAB — TSH: TSH: 5.38 u[IU]/mL — ABNORMAL HIGH (ref 0.350–4.500)

## 2021-04-04 LAB — MAGNESIUM: Magnesium: 2.1 mg/dL (ref 1.7–2.4)

## 2021-04-04 LAB — T4, FREE: Free T4: 0.9 ng/dL (ref 0.61–1.12)

## 2021-04-04 NOTE — Progress Notes (Signed)
[facility] Progress Note     Subjective: Patient seen and examined.  He is resting comfortably in bed.  He continues to state that he is hungry, would like some more to eat today.  He denies nausea, vomiting, fever, and chills.  He continues to pass flatus, but denies any bowel movements.  He also states that he has not received any IV pain medications today, and he has only been taking the oxycodone.  Objective: Vital signs in last 24 hours: Temp:  [98.7 F (37.1 C)-99.4 F (37.4 C)] 98.7 F (37.1 C) (01/26 0557) Pulse Rate:  [96-99] 99 (01/26 0557) Resp:  [17-19] 18 (01/26 0557) BP: (118-125)/(79-84) 122/84 (01/26 0557) SpO2:  [91 %-97 %] 96 % (01/26 0557) Last BM Date: 04/01/21  Intake/Output from previous day: 01/25 0701 - 01/26 0700 In: 1441.8 [P.O.:1200; IV Piggyback:241.8] Out: 3350 [Urine:3350] Intake/Output this shift: Total I/O In: 360 [P.O.:360] Out: -   General appearance: alert, cooperative, and no distress GI: Soft, nondistended, no percussion tenderness, mild tenderness to palpation in left lower quadrant, no rigidity, guarding, rebound tenderness.  Lab Results:  Recent Labs    04/03/21 0450 04/04/21 0545  WBC 13.2* 9.7  HGB 13.5 12.9*  HCT 41.2 38.5*  PLT 253 264   BMET Recent Labs    04/03/21 0450 04/04/21 0545  NA 134* 139  K 3.6 3.5  CL 97* 102  CO2 28 28  GLUCOSE 89 95  BUN 9 5*  CREATININE 0.76 0.67  CALCIUM 8.3* 8.5*   PT/INR No results for input(s): LABPROT, INR in the last 72 hours.  Studies/Results: No results found.  Anti-infectives: Anti-infectives (From admission, onward)    Start     Dose/Rate Route Frequency Ordered Stop   04/01/21 2200  piperacillin-tazobactam (ZOSYN) IVPB 3.375 g        3.375 g 12.5 mL/hr over 240 Minutes Intravenous Every 8 hours 04/01/21 1833     04/01/21 1515  piperacillin-tazobactam (ZOSYN) IVPB 3.375 g        3.375 g 100 mL/hr over 30 Minutes Intravenous  Once 04/01/21 1510  04/01/21 1617       Assessment/Plan: [name] is a 36 year old male who was admitted with sigmoid colitis and microperforation with small 2.5 cm fluid collection posterior to the colitis, noted on CT abdomen and pelvis.  -Patient continues to clinically improve and is asking for more food -Leukocytosis normalized today, 9.7 from 13.2 -Patient's tachycardia has also improved at this time -Advance to FLD -Will consider advancement to GI soft for dinner tonight, if patient tolerates full liquid diet today without nausea, vomiting, and increased abdominal pain -Continue IV Zosyn -Continue IV fluids, will decrease rate given oral intake -Discontinued Dilaudid today, PRN Roxicodone still available and scheduled Tylenol -Will hold off on CT abdomen and pelvis at this time given significant clinical improvement -Possible stability for discharge home in the next 24 to 48 hours -Appreciate primary team recommendations     LOS: 3 days    Talibah Colasurdo A Joscelyne Renville 04/04/2021

## 2021-04-04 NOTE — Progress Notes (Addendum)
PROGRESS NOTE  [name]:[id number] DOB: 1986-02-06 DOA: 04/01/2021 PCP: Pcp, No   Brief History:  36 y.o. male with medical history significant for bipolar disorder, depression,  Traumatic brain injury. Patient was brought to the ED with complaints of lower abdominal pain of 4 days duration and blood in stool.  He also reports rectal pain when he sits down.  Patient is able to answer questions, he denies nausea vomiting or loose stools.  Denies pain with urination.  He describes difficulty breathing.  No cough no fevers no chills.   ED Course: Temperature 99.9.  Tachycardic heart rate 105-139.  Tachypneic, respiratory rate 20-28.  Blood pressure mostly 115-128.  Lactic acid 1.  WBC 17.6.  Abdominal CT-Long segment wall thickening and inflammation involving the mid sigmoid colon compatible with colitis, few mucosal of extraluminal gas and small fluid collection noted posteriorly compatible with microperforation.     Assessment/Plan: sigmoid diverticulitis with microperforation -abd pain continues to improve -WBC trending down -continue IVF -continue IV zosyn -Continue opioids -appreciate general surgery -start clears>>full liquids on 1/26 -passing flatus, no BM -possible d/c in 24 hours   sepsis  -secondary to diverticulitis -Patient met sepsis criteria on admission with tachycardia, elevated WBCs, elevated respiratory rate and identified source of infection on CT abdomen demonstrating sigmoid colitis. -Continue IV fluids and IV antibiotics as mentioned above. -Lactic acid 1.0. -sepsis physiology resolved   Hypokalemia -in the setting of poor oral intake -repleted -Checking magnesium level--2.1   Sinus tachycardia -essentially asymptomatic; due to acute medical condition -obtain EKG--personally reviewed, no concerning STT change, no AVB -TSH--5.380 -Free T4--0.577 -bolused fluid and restarted maintanence -UDS--neg -overall improving    depression/bipolar disorder -Continue psychotropic medication -Counseling reorientation and supportive care. -Mood is a stable at this moment.   tobacco abuse -Cessation counseling provided -Continue nicotine patch.   class II obesity -Body mass index is 35.31 kg/m.  -Calorie diet, portion control and increase physical activity discussed with patient.             Family Communication:   no Family at bedside   Consultants:  general surgery   Code Status:  FULL    DVT Prophylaxis:  [place] Heparin      Procedures: As Listed in Progress Note Above   Antibiotics: Zosyn 1/23>>      Subjective: Patient states abd pain continues to improve.  Denies n/v/d.  +flatus, no BM.  No cp, sob  Objective: Vitals:   04/03/21 1505 04/03/21 2106 04/04/21 0557 04/04/21 1317  BP: 118/79 125/82 122/84 118/75  Pulse: 99 96 99 95  Resp: 17 19 18    Temp: 98.7 F (37.1 C) 99.4 F (37.4 C) 98.7 F (37.1 C) 98.5 F (36.9 C)  TempSrc: Oral   Oral  SpO2: 91% 97% 96% 100%  Weight:      Height:        Intake/Output Summary (Last 24 hours) at 04/04/2021 1553 Last data filed at 04/04/2021 1300 Gross per 24 hour  Intake 2055.76 ml  Output 3900 ml  Net -1844.24 ml   Weight change:  Exam:  General:  Pt is alert, follows commands appropriately, not in acute distress HEENT: No icterus, No thrush, No neck mass, [place]/AT Cardiovascular: RRR, S1/S2, no rubs, no gallops Respiratory: diminished BS but CTA bilaterally, no wheezing, no crackles, no rhonchi Abdomen: Soft/+BS, non tender, non distended, no guarding Extremities: No edema, No lymphangitis, No petechiae, No rashes, no synovitis  Data Reviewed: I have personally reviewed following labs and imaging studies Basic Metabolic Panel: Recent Labs  Lab 04/01/21 1348 04/02/21 0454 04/03/21 0450 04/04/21 0545  NA 134* 136 134* 139  K 3.8 3.3* 3.6 3.5  CL 98 101 97* 102  CO2 25 27 28 28   GLUCOSE 94 100* 89 95  BUN 10 9 9  5*   CREATININE 0.88 0.83 0.76 0.67  CALCIUM 8.9 8.2* 8.3* 8.5*  MG  --  1.7  --  2.1   Liver Function Tests: Recent Labs  Lab 04/01/21 1348 04/04/21 0545  AST 15 11*  ALT 21 12  ALKPHOS 59 45  BILITOT 0.4 0.5  PROT 7.5 6.3*  ALBUMIN 3.7 2.8*   Recent Labs  Lab 04/01/21 1348  LIPASE 22   No results for input(s): AMMONIA in the last 168 hours. Coagulation Profile: No results for input(s): INR, PROTIME in the last 168 hours. CBC: Recent Labs  Lab 04/01/21 1348 04/02/21 0454 04/03/21 0450 04/04/21 0545  WBC 17.6* 17.7* 13.2* 9.7  NEUTROABS 13.0*  --   --   --   HGB 15.7 12.7* 13.5 12.9*  HCT 46.0 39.8 41.2 38.5*  MCV 93.7 94.1 96.5 94.1  PLT 261 209 253 264   Cardiac Enzymes: No results for input(s): CKTOTAL, CKMB, CKMBINDEX, TROPONINI in the last 168 hours. BNP: Invalid input(s): POCBNP CBG: No results for input(s): GLUCAP in the last 168 hours. HbA1C: No results for input(s): HGBA1C in the last 72 hours. Urine analysis:    Component Value Date/Time   COLORURINE COLORLESS (A) 04/03/2021 1839   APPEARANCEUR CLEAR 04/03/2021 1839   LABSPEC 1.002 (L) 04/03/2021 1839   PHURINE 8.0 04/03/2021 1839   GLUCOSEU NEGATIVE 04/03/2021 1839   HGBUR SMALL (A) 04/03/2021 1839   BILIRUBINUR NEGATIVE 04/03/2021 1839   KETONESUR NEGATIVE 04/03/2021 1839   PROTEINUR NEGATIVE 04/03/2021 1839   UROBILINOGEN 0.2 08/09/2008 1812   NITRITE NEGATIVE 04/03/2021 1839   LEUKOCYTESUR NEGATIVE 04/03/2021 1839   Sepsis Labs: @LABRCNTIP (procalcitonin:4,lacticidven:4) ) Recent Results (from the past 240 hour(s))  Resp Panel by RT-PCR (Flu A&B, Covid) Nasopharyngeal Swab     Status: None   Collection Time: 04/01/21  3:20 PM   Specimen: Nasopharyngeal Swab; Nasopharyngeal(NP) swabs in vial transport medium  Result Value Ref Range Status   SARS Coronavirus 2 by RT PCR NEGATIVE NEGATIVE Final    Comment: (NOTE) SARS-CoV-2 target nucleic acids are NOT DETECTED.  The SARS-CoV-2 RNA is  generally detectable in upper respiratory specimens during the acute phase of infection. The lowest concentration of SARS-CoV-2 viral copies this assay can detect is 138 copies/mL. A negative result does not preclude SARS-Cov-2 infection and should not be used as the sole basis for treatment or other patient management decisions. A negative result may occur with  improper specimen collection/handling, submission of specimen other than nasopharyngeal swab, presence of viral mutation(s) within the areas targeted by this assay, and inadequate number of viral copies(<138 copies/mL). A negative result must be combined with clinical observations, patient history, and epidemiological information. The expected result is Negative.  Fact Sheet for Patients:  EntrepreneurPulse.com.au  Fact Sheet for Healthcare Providers:  IncredibleEmployment.be  This test is no t yet approved or cleared by the [place] FDA and  has been authorized for detection and/or diagnosis of SARS-CoV-2 by FDA under an Emergency Use Authorization (EUA). This EUA will remain  in effect (meaning this test can be used) for the duration of the COVID-19 declaration under Section 564(b)(1) of the  Act, 21 U.S.C.section 360bbb-3(b)(1), unless the authorization is terminated  or revoked sooner.       Influenza A by PCR NEGATIVE NEGATIVE Final   Influenza B by PCR NEGATIVE NEGATIVE Final    Comment: (NOTE) The Xpert Xpress SARS-CoV-2/FLU/RSV plus assay is intended as an aid in the diagnosis of influenza from Nasopharyngeal swab specimens and should not be used as a sole basis for treatment. Nasal washings and aspirates are unacceptable for Xpert Xpress SARS-CoV-2/FLU/RSV testing.  Fact Sheet for Patients: EntrepreneurPulse.com.au  Fact Sheet for Healthcare Providers: IncredibleEmployment.be  This test is not yet approved or cleared by the [place] FDA and has been authorized for detection and/or diagnosis of SARS-CoV-2 by FDA under an Emergency Use Authorization (EUA). This EUA will remain in effect (meaning this test can be used) for the duration of the COVID-19 declaration under Section 564(b)(1) of the Act, 21 U.S.C. section 360bbb-3(b)(1), unless the authorization is terminated or revoked.  Performed at [facility], [address] [place], [place] [address]   Blood culture (routine x 2)     Status: None (Preliminary result)   Collection Time: 04/01/21  3:31 PM   Specimen: Left Antecubital; Blood  Result Value Ref Range Status   Specimen Description LEFT ANTECUBITAL  Final   Special Requests   Final    BOTTLES DRAWN AEROBIC AND ANAEROBIC Blood Culture adequate volume   Culture   Final    NO GROWTH 3 DAYS Performed at [facility], [address] [place], [place] [address]    Report Status PENDING  Incomplete  Blood culture (routine x 2)     Status: None (Preliminary result)   Collection Time: 04/01/21  3:31 PM   Specimen: BLOOD LEFT HAND  Result Value Ref Range Status   Specimen Description BLOOD LEFT HAND  Final   Special Requests   Final    BOTTLES DRAWN AEROBIC AND ANAEROBIC Blood Culture adequate volume   Culture   Final    NO GROWTH 3 DAYS Performed at [facility], [address], [place], [place] [address]    Report Status PENDING  Incomplete     Scheduled Meds:  acetaminophen  1,000 mg Oral Q6H   divalproex  500 mg Oral QHS   haloperidol  10 mg Oral QHS   heparin  5,000 Units Subcutaneous Q8H   nicotine  21 mg Transdermal QHS   Continuous Infusions:  lactated ringers 50 mL/hr at 04/04/21 1231   piperacillin-tazobactam 3.375 g (04/04/21 1433)    Procedures/Studies: CT ABDOMEN PELVIS W CONTRAST  Result Date: 04/01/2021 CLINICAL DATA:  Lower abdominal pain, blood in stool. EXAM: CT ABDOMEN AND PELVIS WITH CONTRAST TECHNIQUE: Multidetector CT imaging of the abdomen and pelvis was performed using  the standard protocol following bolus administration of intravenous contrast. RADIATION DOSE REDUCTION: This exam was performed according to the departmental dose-optimization program which includes automated exposure control, adjustment of the mA and/or kV according to patient size and/or use of iterative reconstruction technique. CONTRAST:  139m OMNIPAQUE IOHEXOL 300 MG/ML  SOLN COMPARISON:  None. FINDINGS: Lower chest: No acute abnormality. Hepatobiliary: No focal hepatic abnormality. Gallbladder unremarkable. Pancreas: No focal abnormality or ductal dilatation. Spleen: No focal abnormality.  Normal size. Adrenals/Urinary Tract: No adrenal abnormality. No focal renal abnormality. No stones or hydronephrosis. Urinary bladder is unremarkable. Stomach/Bowel: There is long segment wall thickening in the sigmoid colon with surrounding inflammation/stranding. A few locules of extraluminal gas are noted posteriorly with a small amount of fluid concerning for  micro perforation. Small fluid collection measures up to 2.5 cm. Given the long segment of involvement and lack of visible diverticula, this is presumably related to colitis. Stomach and small bowel decompressed, unremarkable. Appendix is normal. Vascular/Lymphatic: No evidence of aneurysm or adenopathy. Reproductive: No visible focal abnormality. Other: No free fluid or free air. Musculoskeletal: No acute bony abnormality. IMPRESSION: Long segment wall thickening and inflammation involving the mid sigmoid colon compatible with colitis. Few locules of extraluminal gas and small fluid collection noted posteriorly compatible with micro perforation. These results were called by telephone at the time of interpretation on 04/01/2021 at 5:19 pm to provider Fredia Sorrow, who verbally acknowledged these results. Electronically Signed   By: Rolm Baptise M.D.   On: 04/01/2021 17:25    Orson Eva, DO  [facility]  If 7PM-7AM, please contact  night-coverage [email or web address] Password [facility] 04/04/2021, 3:53 PM   LOS: 3 days

## 2021-04-04 NOTE — Progress Notes (Signed)
Pharmacy Antibiotic Note  [name] is a 36 y.o. male admitted on 04/01/2021 with  inttra-abdominal infection .  Pharmacy has been consulted for Zosyn dosing.  Plan: Continue Zosyn 3.375g IV q8h (4 hour infusion). Monitor labs, c/s, and patient improvement  Height: 6' (182.9 cm) Weight: 118.1 kg (260 lb 5.8 oz) IBW/kg (Calculated) : 77.6  Temp (24hrs), Avg:98.9 F (37.2 C), Min:98.7 F (37.1 C), Max:99.4 F (37.4 C)  Recent Labs  Lab 04/01/21 1348 04/01/21 1531 04/02/21 0454 04/03/21 0450 04/04/21 0545  WBC 17.6*  --  17.7* 13.2* 9.7  CREATININE 0.88  --  0.83 0.76 0.67  LATICACIDVEN  --  1.0  --   --   --      Estimated Creatinine Clearance: 174.2 mL/min (by C-G formula based on SCr of 0.67 mg/dL).    No Known Allergies  Antimicrobials this admission: 1/23 Zosyn >>  Microbiology results: 1/23 BCx: ngtd 1/25 Ucx: pending   Thank you for allowing pharmacy to be a part of this patients care.  Tad Moore 04/04/2021 11:17 AM

## 2021-04-05 LAB — CBC
HCT: 41.9 % (ref 39.0–52.0)
Hemoglobin: 13.4 g/dL (ref 13.0–17.0)
MCH: 30.1 pg (ref 26.0–34.0)
MCHC: 32 g/dL (ref 30.0–36.0)
MCV: 94.2 fL (ref 80.0–100.0)
Platelets: 301 10*3/uL (ref 150–400)
RBC: 4.45 MIL/uL (ref 4.22–5.81)
RDW: 13.4 % (ref 11.5–15.5)
WBC: 8.2 10*3/uL (ref 4.0–10.5)
nRBC: 0 % (ref 0.0–0.2)

## 2021-04-05 LAB — URINE CULTURE: Culture: NO GROWTH

## 2021-04-05 LAB — BASIC METABOLIC PANEL
Anion gap: 10 (ref 5–15)
BUN: 6 mg/dL (ref 6–20)
CO2: 30 mmol/L (ref 22–32)
Calcium: 8.7 mg/dL — ABNORMAL LOW (ref 8.9–10.3)
Chloride: 101 mmol/L (ref 98–111)
Creatinine, Ser: 0.84 mg/dL (ref 0.61–1.24)
GFR, Estimated: >60 mL/min (ref >60)
Glucose, Bld: 88 mg/dL (ref 70–99)
Potassium: 4 mmol/L (ref 3.5–5.1)
Sodium: 141 mmol/L (ref 135–145)

## 2021-04-05 LAB — MAGNESIUM: Magnesium: 2.1 mg/dL (ref 1.7–2.4)

## 2021-04-05 MED ORDER — DOCUSATE SODIUM 100 MG PO CAPS
100.0000 mg | ORAL_CAPSULE | Freq: Two times a day (BID) | ORAL | Status: DC
  Administered 2021-04-05: 100 mg via ORAL
  Filled 2021-04-05: qty 1

## 2021-04-05 MED ORDER — AMOXICILLIN-POT CLAVULANATE 875-125 MG PO TABS
1.0000 | ORAL_TABLET | Freq: Two times a day (BID) | ORAL | Status: DC
  Administered 2021-04-05: 1 via ORAL
  Filled 2021-04-05: qty 1

## 2021-04-05 MED ORDER — AMOXICILLIN-POT CLAVULANATE 875-125 MG PO TABS: 1.0000 | ORAL_TABLET | Freq: Two times a day (BID) | ORAL | 0 refills | Status: DC

## 2021-04-05 MED ORDER — OXYCODONE HCL 5 MG PO TABS: 5.0000 mg | ORAL_TABLET | ORAL | 0 refills | Status: DC | PRN

## 2021-04-05 NOTE — Progress Notes (Signed)
[facility] Progress Note     Subjective: Patient seen and examined.  He is resting comfortably in bed.  He states that he feels very good this morning.  He was able to have a bowel movement yesterday without significant difficulty.  He continues to pass flatus.  He tolerated his GI soft diet for dinner eaten for breakfast this morning without nausea and vomiting.  Objective: Vital signs in last 24 hours: Temp:  [97.8 F (36.6 C)-98.5 F (36.9 C)] 97.8 F (36.6 C) (01/27 0552) Pulse Rate:  [85-95] 94 (01/27 0552) Resp:  [18-19] 18 (01/27 0552) BP: (108-118)/(72-84) 115/84 (01/27 0552) SpO2:  [98 %-100 %] 98 % (01/27 0552) Last BM Date: 04/04/21  Intake/Output from previous day: 01/26 0701 - 01/27 0700 In: 1440 [P.O.:1440] Out: 2750 [Urine:2750] Intake/Output this shift: Total I/O In: 240 [P.O.:240] Out: 200 [Urine:200]  General appearance: alert, cooperative, and no distress GI: Soft, nondistended, no percussion tenderness, mild tenderness to palpation in the left lower quadrant, no rigidity, guarding, rebound tenderness  Lab Results:  Recent Labs    04/04/21 0545 04/05/21 0432  WBC 9.7 8.2  HGB 12.9* 13.4  HCT 38.5* 41.9  PLT 264 301   BMET Recent Labs    04/04/21 0545 04/05/21 0432  NA 139 141  K 3.5 4.0  CL 102 101  CO2 28 30  GLUCOSE 95 88  BUN 5* 6  CREATININE 0.67 0.84  CALCIUM 8.5* 8.7*   PT/INR No results for input(s): LABPROT, INR in the last 72 hours.  Studies/Results: No results found.  Anti-infectives: Anti-infectives (From admission, onward)    Start     Dose/Rate Route Frequency Ordered Stop   04/01/21 2200  piperacillin-tazobactam (ZOSYN) IVPB 3.375 g        3.375 g 12.5 mL/hr over 240 Minutes Intravenous Every 8 hours 04/01/21 1833     04/01/21 1515  piperacillin-tazobactam (ZOSYN) IVPB 3.375 g        3.375 g 100 mL/hr over 30 Minutes Intravenous  Once 04/01/21 1510 04/01/21 1617        Assessment/Plan: [name] is a 36 year old male who was admitted with sigmoid colitis and microperforation with small 2.5 cm fluid collection posterior to the colitis, noted on CT abdomen pelvis.  -Patient feeling very well and able to tolerate GI soft diet without nausea and vomiting -WBC 8.2 -Continue GI soft diet -Continue IV Zosyn -Will Hep-Lock IV fluids -Patient stable for discharge from general surgery standpoint -Recommend discharge with 1 week of Augmentin, Roxicodone PRN for pain, scheduled Tylenol 650 mg every 6 hours, and Colace 100 mg twice daily -Follow up with me in 2 weeks -Patient will require colonoscopy in 6-8 weeks to further evaluate area of inflammation   LOS: 4 days    [name] A Saramarie Stinger 04/05/2021

## 2021-04-05 NOTE — Progress Notes (Signed)
Discharge instructions reviewed with pt's legal guardian, mother [name] [facility]. Pt discharged ambulatory to POV with mother.

## 2021-04-05 NOTE — Discharge Summary (Signed)
Physician Discharge Summary  [name] DXA:[id number] DOB: 1985-09-27 DOA: 04/01/2021  PCP: Pcp, No  Admit date: 04/01/2021 Discharge date: 04/05/2021  Admitted From: Home Disposition:  Home   Recommendations for Outpatient Follow-up:  Follow up with PCP in 1-2 weeks Please obtain BMP/CBC in one week    Discharge Condition: Stable CODE STATUS:FULL Diet recommendation: soft   Brief/Interim Summary: 36 y.o. male with medical history significant for bipolar disorder, depression,  Traumatic brain injury. Patient was brought to the ED with complaints of lower abdominal pain of 4 days duration and blood in stool.  He also reports rectal pain when he sits down.  Patient is able to answer questions, he denies nausea vomiting or loose stools.  Denies pain with urination.  He describes difficulty breathing.  No cough no fevers no chills.   ED Course: Temperature 99.9.  Tachycardic heart rate 105-139.  Tachypneic, respiratory rate 20-28.  Blood pressure mostly 115-128.  Lactic acid 1.  WBC 17.6.  Abdominal CT-Long segment wall thickening and inflammation involving the mid sigmoid colon compatible with colitis, few mucosal of extraluminal gas and small fluid collection noted posteriorly compatible with microperforation. He was placed on bowel rest, IVF, IV zosyn, and opioids.  He improved slowly and his diet was gradually advanced which he tolerated.  Discharge Diagnoses:   sigmoid diverticulitis with microperforation -abd pain continues to improve -WBC trending down--8.2 on day of d/c -continue IVF -continue IV zosyn>>home with amox/clav x 7 days -Continue opioids>>d/c home with oxycodone 5 mg q 4 prn pain -appreciate general surgery -start clears>>full liquids>>soft diet which he tolerated well -passing flatus, no BM   sepsis  -secondary to diverticulitis -Patient met sepsis criteria on admission with tachycardia, elevated WBCs, elevated respiratory rate and identified source  of infection on CT abdomen demonstrating sigmoid colitis. -Continue IV fluids and IV antibiotics as mentioned above. -Lactic acid 1.0. -sepsis physiology resolved   Hypokalemia -in the setting of poor oral intake -repleted -Checking magnesium level--2.1   Sinus tachycardia -essentially asymptomatic; due to acute medical condition -obtain EKG--personally reviewed, no concerning STT change, no AVB -TSH--5.380 -Free T4--0.577 -bolused fluid and restarted maintanence -UDS--neg -overall improving   depression/bipolar disorder -Continue psychotropic medication -Counseling reorientation and supportive care. -Mood is a stable at this moment.   tobacco abuse -Cessation counseling provided -Continue nicotine patch.   class II obesity -Body mass index is 35.31 kg/m.  -Calorie diet, portion control and increase physical activity discussed with patient.      Discharge Instructions   Allergies as of 04/05/2021   No Known Allergies      Medication List     STOP taking these medications    carbamazepine 200 MG tablet Commonly known as: TEGRETOL   citalopram 20 MG tablet Commonly known as: CELEXA   risperiDONE 2 MG tablet Commonly known as: RISPERDAL       TAKE these medications    acetaminophen 500 MG tablet Commonly known as: TYLENOL Take 1,000 mg by mouth every 6 (six) hours as needed.   amoxicillin-clavulanate 875-125 MG tablet Commonly known as: AUGMENTIN Take 1 tablet by mouth every 12 (twelve) hours.   clonazePAM 0.5 MG tablet Commonly known as: KLONOPIN Take 0.5 mg by mouth daily as needed.   divalproex 500 MG DR tablet Commonly known as: DEPAKOTE Take 500 mg by mouth at bedtime.   haloperidol 10 MG tablet Commonly known as: HALDOL Take 10 mg by mouth See admin instructions. Take 1 and 1/2 tablets every evening  LIPITOR PO Take 10 mg by mouth daily.   oxyCODONE 5 MG immediate release tablet Commonly known as: Oxy IR/ROXICODONE Take 1  tablet (5 mg total) by mouth every 4 (four) hours as needed for moderate pain.        Follow-up Information     Pappayliou, Flint Melter, DO. Call.   Specialty: General Surgery Why: Call to make an appointment in 2 weeks to follow up after hospitalization Contact information: [address] [facility]na [facility] [place] [address] [phone]                No Known Allergies  Consultations: General surgery   Procedures/Studies: CT ABDOMEN PELVIS W CONTRAST  Result Date: 04/01/2021 CLINICAL DATA:  Lower abdominal pain, blood in stool. EXAM: CT ABDOMEN AND PELVIS WITH CONTRAST TECHNIQUE: Multidetector CT imaging of the abdomen and pelvis was performed using the standard protocol following bolus administration of intravenous contrast. RADIATION DOSE REDUCTION: This exam was performed according to the departmental dose-optimization program which includes automated exposure control, adjustment of the mA and/or kV according to patient size and/or use of iterative reconstruction technique. CONTRAST:  162m OMNIPAQUE IOHEXOL 300 MG/ML  SOLN COMPARISON:  None. FINDINGS: Lower chest: No acute abnormality. Hepatobiliary: No focal hepatic abnormality. Gallbladder unremarkable. Pancreas: No focal abnormality or ductal dilatation. Spleen: No focal abnormality.  Normal size. Adrenals/Urinary Tract: No adrenal abnormality. No focal renal abnormality. No stones or hydronephrosis. Urinary bladder is unremarkable. Stomach/Bowel: There is long segment wall thickening in the sigmoid colon with surrounding inflammation/stranding. A few locules of extraluminal gas are noted posteriorly with a small amount of fluid concerning for micro perforation. Small fluid collection measures up to 2.5 cm. Given the long segment of involvement and lack of visible diverticula, this is presumably related to colitis. Stomach and small bowel decompressed, unremarkable. Appendix is normal. Vascular/Lymphatic: No evidence of aneurysm or  adenopathy. Reproductive: No visible focal abnormality. Other: No free fluid or free air. Musculoskeletal: No acute bony abnormality. IMPRESSION: Long segment wall thickening and inflammation involving the mid sigmoid colon compatible with colitis. Few locules of extraluminal gas and small fluid collection noted posteriorly compatible with micro perforation. These results were called by telephone at the time of interpretation on 04/01/2021 at 5:19 pm to provider SFredia Sorrow who verbally acknowledged these results. Electronically Signed   By: KRolm BaptiseM.D.   On: 04/01/2021 17:25        Discharge Exam: Vitals:   04/04/21 2112 04/05/21 0552  BP: 108/72 115/84  Pulse: 85 94  Resp: 19 18  Temp: 98.5 F (36.9 C) 97.8 F (36.6 C)  SpO2: 98% 98%   Vitals:   04/04/21 0557 04/04/21 1317 04/04/21 2112 04/05/21 0552  BP: 122/84 118/75 108/72 115/84  Pulse: 99 95 85 94  Resp: 18  19 18   Temp: 98.7 F (37.1 C) 98.5 F (36.9 C) 98.5 F (36.9 C) 97.8 F (36.6 C)  TempSrc:  Oral    SpO2: 96% 100% 98% 98%  Weight:      Height:        General: Pt is alert, awake, not in acute distress Cardiovascular: RRR, S1/S2 +, no rubs, no gallops Respiratory: CTA bilaterally, no wheezing, no rhonchi Abdominal: Soft, LLQ tender mild, ND, bowel sounds + Extremities: no edema, no cyanosis   The results of significant diagnostics from this hospitalization (including imaging, microbiology, ancillary and laboratory) are listed below for reference.    Significant Diagnostic Studies: CT ABDOMEN PELVIS W CONTRAST  Result Date: 04/01/2021 CLINICAL  DATA:  Lower abdominal pain, blood in stool. EXAM: CT ABDOMEN AND PELVIS WITH CONTRAST TECHNIQUE: Multidetector CT imaging of the abdomen and pelvis was performed using the standard protocol following bolus administration of intravenous contrast. RADIATION DOSE REDUCTION: This exam was performed according to the departmental dose-optimization program which  includes automated exposure control, adjustment of the mA and/or kV according to patient size and/or use of iterative reconstruction technique. CONTRAST:  116m OMNIPAQUE IOHEXOL 300 MG/ML  SOLN COMPARISON:  None. FINDINGS: Lower chest: No acute abnormality. Hepatobiliary: No focal hepatic abnormality. Gallbladder unremarkable. Pancreas: No focal abnormality or ductal dilatation. Spleen: No focal abnormality.  Normal size. Adrenals/Urinary Tract: No adrenal abnormality. No focal renal abnormality. No stones or hydronephrosis. Urinary bladder is unremarkable. Stomach/Bowel: There is long segment wall thickening in the sigmoid colon with surrounding inflammation/stranding. A few locules of extraluminal gas are noted posteriorly with a small amount of fluid concerning for micro perforation. Small fluid collection measures up to 2.5 cm. Given the long segment of involvement and lack of visible diverticula, this is presumably related to colitis. Stomach and small bowel decompressed, unremarkable. Appendix is normal. Vascular/Lymphatic: No evidence of aneurysm or adenopathy. Reproductive: No visible focal abnormality. Other: No free fluid or free air. Musculoskeletal: No acute bony abnormality. IMPRESSION: Long segment wall thickening and inflammation involving the mid sigmoid colon compatible with colitis. Few locules of extraluminal gas and small fluid collection noted posteriorly compatible with micro perforation. These results were called by telephone at the time of interpretation on 04/01/2021 at 5:19 pm to provider SFredia Sorrow who verbally acknowledged these results. Electronically Signed   By: KRolm BaptiseM.D.   On: 04/01/2021 17:25    Microbiology: Recent Results (from the past 240 hour(s))  Resp Panel by RT-PCR (Flu A&B, Covid) Nasopharyngeal Swab     Status: None   Collection Time: 04/01/21  3:20 PM   Specimen: Nasopharyngeal Swab; Nasopharyngeal(NP) swabs in vial transport medium  Result Value Ref  Range Status   SARS Coronavirus 2 by RT PCR NEGATIVE NEGATIVE Final    Comment: (NOTE) SARS-CoV-2 target nucleic acids are NOT DETECTED.  The SARS-CoV-2 RNA is generally detectable in upper respiratory specimens during the acute phase of infection. The lowest concentration of SARS-CoV-2 viral copies this assay can detect is 138 copies/mL. A negative result does not preclude SARS-Cov-2 infection and should not be used as the sole basis for treatment or other patient management decisions. A negative result may occur with  improper specimen collection/handling, submission of specimen other than nasopharyngeal swab, presence of viral mutation(s) within the areas targeted by this assay, and inadequate number of viral copies(<138 copies/mL). A negative result must be combined with clinical observations, patient history, and epidemiological information. The expected result is Negative.  Fact Sheet for Patients:  hEntrepreneurPulse.com.au Fact Sheet for Healthcare Providers:  hIncredibleEmployment.be This test is no t yet approved or cleared by the [place] and  has been authorized for detection and/or diagnosis of SARS-CoV-2 by FDA under an Emergency Use Authorization (EUA). This EUA will remain  in effect (meaning this test can be used) for the duration of the COVID-19 declaration under Section 564(b)(1) of the Act, 21 U.S.C.section 360bbb-3(b)(1), unless the authorization is terminated  or revoked sooner.       Influenza A by PCR NEGATIVE NEGATIVE Final   Influenza B by PCR NEGATIVE NEGATIVE Final    Comment: (NOTE) The Xpert Xpress SARS-CoV-2/FLU/RSV plus assay is intended as an aid in the diagnosis  of influenza from Nasopharyngeal swab specimens and should not be used as a sole basis for treatment. Nasal washings and aspirates are unacceptable for Xpert Xpress SARS-CoV-2/FLU/RSV testing.  Fact Sheet for  Patients: EntrepreneurPulse.com.au  Fact Sheet for Healthcare Providers: IncredibleEmployment.be  This test is not yet approved or cleared by the [place] FDA and has been authorized for detection and/or diagnosis of SARS-CoV-2 by FDA under an Emergency Use Authorization (EUA). This EUA will remain in effect (meaning this test can be used) for the duration of the COVID-19 declaration under Section 564(b)(1) of the Act, 21 U.S.C. section 360bbb-3(b)(1), unless the authorization is terminated or revoked.  Performed at [facility], [address], [place], [place] [address]   Blood culture (routine x 2)     Status: None (Preliminary result)   Collection Time: 04/01/21  3:31 PM   Specimen: Left Antecubital; Blood  Result Value Ref Range Status   Specimen Description LEFT ANTECUBITAL  Final   Special Requests   Final    BOTTLES DRAWN AEROBIC AND ANAEROBIC Blood Culture adequate volume   Culture   Final    NO GROWTH 3 DAYS Performed at [facility], [address], [place], [place] [address]    Report Status PENDING  Incomplete  Blood culture (routine x 2)     Status: None (Preliminary result)   Collection Time: 04/01/21  3:31 PM   Specimen: BLOOD LEFT HAND  Result Value Ref Range Status   Specimen Description BLOOD LEFT HAND  Final   Special Requests   Final    BOTTLES DRAWN AEROBIC AND ANAEROBIC Blood Culture adequate volume   Culture   Final    NO GROWTH 3 DAYS Performed at [facility], [address] [place], [place] [address]    Report Status PENDING  Incomplete  Urine Culture     Status: None   Collection Time: 04/03/21  6:39 PM   Specimen: Urine, Clean Catch  Result Value Ref Range Status   Specimen Description   Final    URINE, CLEAN CATCH Performed at [facility], [address], [place], [place] [address]    Special Requests   Final    NONE Performed at [facility], [address] [place], [place] [address]    Culture    Final    NO GROWTH Performed at [facility], [place] [address] [place],  [address]    Report Status 04/05/2021 FINAL  Final     Labs: Basic Metabolic Panel: Recent Labs  Lab 04/01/21 1348 04/02/21 0454 04/03/21 0450 04/04/21 0545 04/05/21 0432  NA 134* 136 134* 139 141  K 3.8 3.3* 3.6 3.5 4.0  CL 98 101 97* 102 101  CO2 25 27 28 28 30   GLUCOSE 94 100* 89 95 88  BUN 10 9 9  5* 6  CREATININE 0.88 0.83 0.76 0.67 0.84  CALCIUM 8.9 8.2* 8.3* 8.5* 8.7*  MG  --  1.7  --  2.1 2.1   Liver Function Tests: Recent Labs  Lab 04/01/21 1348 04/04/21 0545  AST 15 11*  ALT 21 12  ALKPHOS 59 45  BILITOT 0.4 0.5  PROT 7.5 6.3*  ALBUMIN 3.7 2.8*   Recent Labs  Lab 04/01/21 1348  LIPASE 22   No results for input(s): AMMONIA in the last 168 hours. CBC: Recent Labs  Lab 04/01/21 1348 04/02/21 0454 04/03/21 0450 04/04/21 0545 04/05/21 0432  WBC 17.6* 17.7* 13.2* 9.7 8.2  NEUTROABS 13.0*  --   --   --   --  HGB 15.7 12.7* 13.5 12.9* 13.4  HCT 46.0 39.8 41.2 38.5* 41.9  MCV 93.7 94.1 96.5 94.1 94.2  PLT 261 209 253 264 301   Cardiac Enzymes: No results for input(s): CKTOTAL, CKMB, CKMBINDEX, TROPONINI in the last 168 hours. BNP: Invalid input(s): POCBNP CBG: No results for input(s): GLUCAP in the last 168 hours.  Time coordinating discharge:  36 minutes  Signed:  Orson Eva, DO [facility] Pager: [phone] 04/05/2021, 11:23 AM

## 2021-04-06 LAB — CULTURE, BLOOD (ROUTINE X 2)
Culture: NO GROWTH
Culture: NO GROWTH
Special Requests: ADEQUATE
Special Requests: ADEQUATE

## 2021-04-18 ENCOUNTER — Ambulatory Visit (INDEPENDENT_AMBULATORY_CARE_PROVIDER_SITE_OTHER): Payer: Medicaid Other | Admitting: Surgery

## 2021-04-18 ENCOUNTER — Encounter: Payer: Self-pay | Admitting: Surgery

## 2021-04-18 ENCOUNTER — Other Ambulatory Visit: Payer: Self-pay

## 2021-04-18 VITALS — BP 113/75 | HR 94 | Temp 98.4°F | Resp 16 | Ht 77.0 in | Wt 253.0 lb

## 2021-04-18 DIAGNOSIS — K5732 Diverticulitis of large intestine without perforation or abscess without bleeding: Secondary | ICD-10-CM

## 2021-04-18 NOTE — Progress Notes (Signed)
[facility] Note   HPI:  36 y.o. Male presents to clinic for for follow-up after hospitalization for colitis with microperforation.  Since discharge from the hospital, his abdominal pain has continued to improve.  He does occasionally have some left lower quadrant pain, but he rates this as a 2 out of 10.  He has been moving his bowels every other day without difficulty.  He is eating without nausea and vomiting.  He denies fever and chills, and he has no other complaints at this time.  Review of Systems:  All other review of systems: otherwise negative   Vital Signs:  BP 113/75    Pulse 94    Temp 98.4 F (36.9 C) (Other (Comment))    Resp 16    Ht 6\' 5"  (1.956 m)    Wt 253 lb (114.8 kg)    SpO2 94%    BMI 30.00 kg/m    Physical Exam:  Physical Exam Vitals reviewed.  Constitutional:      Appearance: Normal appearance.  Abdominal:     Comments: Abdomen soft, nondistended, no percussion tenderness, no tenderness to palpation, no rigidity, guarding, rebound tenderness  Neurological:     Mental Status: He is alert.   Laboratory studies: None   Imaging:  None   Assessment:  36 y.o. yo Male who presents for follow-up status post hospitalization for sigmoid colitis with microperforation.  Plan:  -Patient has significant clinical improvement, with improvement of his abdominal pain without any fevers or chills -Patient may go back to any regular activities as tolerated and regular diet as tolerated -He will require colonoscopy in 6 weeks, after he has had enough time for his associated inflammation to subside, to evaluate for any underlying pathology as the cause for his colitis -Phone follow-up scheduled for 4 weeks from now, and will discuss colonoscopy and schedule at that time  All of the patient's questions were answered to his expressed satisfaction  Graciella Freer, DO [facility] [address] [place], [place]  [phone] (office)

## 2021-05-14 ENCOUNTER — Ambulatory Visit (INDEPENDENT_AMBULATORY_CARE_PROVIDER_SITE_OTHER): Payer: Medicaid Other | Admitting: Surgery

## 2021-05-14 ENCOUNTER — Other Ambulatory Visit: Payer: Self-pay

## 2021-05-14 ENCOUNTER — Encounter: Payer: Self-pay | Admitting: Surgery

## 2021-05-14 VITALS — BP 115/83 | HR 99 | Temp 97.9°F | Resp 12 | Ht 77.0 in | Wt 252.0 lb

## 2021-05-14 DIAGNOSIS — K529 Noninfective gastroenteritis and colitis, unspecified: Secondary | ICD-10-CM | POA: Diagnosis not present

## 2021-05-14 MED ORDER — SUTAB 1479-225-188 MG PO TABS: ORAL_TABLET | ORAL | 0 refills | Status: DC

## 2021-05-14 NOTE — H&P (Signed)
[facility] History and Physical ? ? ?[name] is a 36 y.o. male.  ?HPI: Patient presents to clinic for for after hospitalization for colitis with microperforation.  He has been doing well since hospitalization, denying abdominal pain, tolerating his diet, and moving his bowels without issue.  He is here to discuss colonoscopy.  He denies fevers and chills.  He has no history of abdominal surgeries. ? ?Past Medical History:  ?Diagnosis Date  ? Anxiety   ? Bipolar 1 disorder (HCC)   ? Brain injury   ? ? ?Past Surgical History:  ?Procedure Laterality Date  ? ANKLE SURGERY    ? FRACTURE SURGERY    ? ? ?No family history on file. ? ?Social History  ? ?Tobacco Use  ? Smoking status: Every Day  ?  Packs/day: 1.00  ?  Years: 4.00  ?  Pack years: 4.00  ?  Types: Cigarettes  ?Vaping Use  ? Vaping Use: Every day  ?Substance Use Topics  ? Alcohol use: No  ? Drug use: Yes  ?  Types: Marijuana  ?  Comment: 3 days ago  ? ? ?Medications: I have reviewed the patient's current medications. ?Allergies as of 05/14/2021   ?No Known Allergies ?  ? ?  ?Medication List  ?  ? ? Notice   ?Cannot display discharge medications because the patient has not yet been admitted. ?  ? ? ? ?ROS:  ?Constitutional: negative for chills, fatigue, and fevers ?Respiratory: negative for cough, wheezing, and shortness of breath ?Cardiovascular: negative for chest pain and palpitations ?Gastrointestinal: negative for abdominal pain, nausea, reflux symptoms, and vomiting ?Musculoskeletal:negative for back pain, neck pain, and joint pain ? ?Vital Signs:  ?BP 115/83   Pulse 99   Temp 97.9 ?F (36.6 ?C) (Other (Comment))   Resp 12   Ht 6\' 5"  (1.956 m)   Wt 252 lb (114.3 kg)   SpO2 96%   BMI 29.88 kg/m?   ?  ?Physical Exam:  ?Physical Exam ?Vitals reviewed.  ?Constitutional:   ?   Appearance: Normal appearance.  ?HENT:  ?   Head: Normocephalic and atraumatic.  ?Eyes:  ?   Extraocular Movements: Extraocular movements intact.  ?    Pupils: Pupils are equal, round, and reactive to light.  ?Cardiovascular:  ?   Rate and Rhythm: Normal rate.  ?Pulmonary:  ?   Effort: Pulmonary effort is normal.  ?Abdominal:  ?   General: There is no distension.  ?   Palpations: Abdomen is soft.  ?   Tenderness: There is no abdominal tenderness.  ?Musculoskeletal:     ?   General: Normal range of motion.  ?   Cervical back: Normal range of motion.  ?Skin: ?   General: Skin is warm and dry.  ?Neurological:  ?   General: No focal deficit present.  ?   Mental Status: He is alert and oriented to person, place, and time.  ?Psychiatric:     ?   Mood and Affect: Mood normal.     ?   Behavior: Behavior normal.  ? ?Results: ?No results found for this or any previous visit (from the past 48 hour(s)). ? ?No results found. ? ? ?Assessment:  ?36 y.o. yo Male who presents to discuss colonoscopy after hospitalization for colitis with microperforation. ?  ?Plan:  ?-We discussed the importance of colonoscopy after his recent hospitalization, as his inflammation could have been caused by some underlying pathology like a malignancy, which we do not  want to miss.  I explained the procedure to the patient. ?-We discussed that he would require a colonoscopy prep.  Information regarding Sutab prep was provided to the patient, and prescription was sent to his pharmacy.  Patient was also given information to obtain a coupon for purchase of the prep. ?-The risk and benefits of colonoscopy were discussed with the patient, including but not limited to bleeding, infection, missing small lesions, and perforation of colon requiring surgical intervention.  The patient is agreeable to the procedure, and informed consent was obtained. ?-Patient tentatively scheduled for 3/28 ?-Advised the patient to call if he has any further questions or concerns ?  ?All of the above recommendations were discussed with the patient, and all of patient's questions were answered to his expressed  satisfaction. ? ?Theophilus Kinds, DO ?[facility] ?[address] ?[place], [place] [address] ?[phone] (office) ? ?

## 2021-05-14 NOTE — Progress Notes (Signed)
[facility] Note  ? ?HPI:  ?36 y.o. Male presents to clinic for for after hospitalization for colitis with microperforation.  He has been doing well since hospitalization, denying abdominal pain, tolerating his diet, and moving his bowels without issue.  He is here to discuss colonoscopy.  He denies fevers and chills. ? ?Review of Systems:  ?All other review of systems: otherwise negative  ? ?Vital Signs:  ?BP 115/83   Pulse 99   Temp 97.9 ?F (36.6 ?C) (Other (Comment))   Resp 12   Ht 6\' 5"  (1.956 m)   Wt 252 lb (114.3 kg)   SpO2 96%   BMI 29.88 kg/m?   ? ?Physical Exam:  ?Physical Exam ?Vitals reviewed.  ?Constitutional:   ?   Appearance: Normal appearance.  ?HENT:  ?   Head: Normocephalic and atraumatic.  ?Eyes:  ?   Extraocular Movements: Extraocular movements intact.  ?   Pupils: Pupils are equal, round, and reactive to light.  ?Cardiovascular:  ?   Rate and Rhythm: Normal rate.  ?Pulmonary:  ?   Effort: Pulmonary effort is normal.  ?Abdominal:  ?   General: There is no distension.  ?   Palpations: Abdomen is soft.  ?   Tenderness: There is no abdominal tenderness.  ?Musculoskeletal:     ?   General: Normal range of motion.  ?   Cervical back: Normal range of motion.  ?Skin: ?   General: Skin is warm and dry.  ?Neurological:  ?   General: No focal deficit present.  ?   Mental Status: He is alert and oriented to person, place, and time.  ?Psychiatric:     ?   Mood and Affect: Mood normal.     ?   Behavior: Behavior normal.  ? ? ?Laboratory studies: None  ? ?Imaging:  ?None ? ?Assessment:  ?36 y.o. yo Male who presents to discuss colonoscopy after hospitalization for colitis with microperforation. ? ?Plan:  ?-We discussed the importance of colonoscopy after his recent hospitalization, as his inflammation could have been caused by some underlying pathology like a malignancy, which we do not want to miss.  I explained the procedure to the patient. ?-We discussed that he would require a  colonoscopy prep.  Information regarding Sutab prep was provided to the patient, and prescription was sent to his pharmacy.  Patient was also given information to obtain a coupon for purchase of the prep. ?-The risk and benefits of colonoscopy were discussed with the patient, including but not limited to bleeding, infection, missing small lesions, and perforation of colon requiring surgical intervention.  The patient is agreeable to the procedure, and informed consent was obtained. ?-Patient tentatively scheduled for 3/28 ?-Advised the patient to call if he has any further questions or concerns ? ?All of the above recommendations were discussed with the patient, and all of patient's questions were answered to his expressed satisfaction. ? ?4/28, DO ?[facility] ?[address] ?[place], [place] ?[phone] (office) ? ?

## 2021-05-16 ENCOUNTER — Ambulatory Visit: Payer: Medicaid Other | Admitting: Surgery

## 2021-06-04 ENCOUNTER — Encounter (HOSPITAL_COMMUNITY): Payer: Self-pay | Admitting: Surgery

## 2021-06-04 ENCOUNTER — Other Ambulatory Visit: Payer: Self-pay

## 2021-06-04 ENCOUNTER — Ambulatory Visit (HOSPITAL_BASED_OUTPATIENT_CLINIC_OR_DEPARTMENT_OTHER): Payer: Medicaid Other | Admitting: Certified Registered Nurse Anesthetist

## 2021-06-04 ENCOUNTER — Ambulatory Visit (HOSPITAL_COMMUNITY)
Admission: RE | Admit: 2021-06-04 | Discharge: 2021-06-04 | Disposition: A | Discharge status: Home / Self Care | Payer: Medicaid Other | Attending: Surgery | Admitting: Surgery

## 2021-06-04 ENCOUNTER — Encounter (HOSPITAL_COMMUNITY)
Admission: RE | Disposition: A | Discharge status: Home / Self Care | Payer: Self-pay | Source: Home / Self Care | Attending: Surgery

## 2021-06-04 ENCOUNTER — Ambulatory Visit (HOSPITAL_COMMUNITY): Payer: Medicaid Other | Admitting: Certified Registered Nurse Anesthetist

## 2021-06-04 DIAGNOSIS — F418 Other specified anxiety disorders: Secondary | ICD-10-CM

## 2021-06-04 DIAGNOSIS — F319 Bipolar disorder, unspecified: Secondary | ICD-10-CM | POA: Insufficient documentation

## 2021-06-04 DIAGNOSIS — K529 Noninfective gastroenteritis and colitis, unspecified: Secondary | ICD-10-CM | POA: Diagnosis not present

## 2021-06-04 DIAGNOSIS — K5730 Diverticulosis of large intestine without perforation or abscess without bleeding: Secondary | ICD-10-CM

## 2021-06-04 DIAGNOSIS — F419 Anxiety disorder, unspecified: Secondary | ICD-10-CM | POA: Diagnosis not present

## 2021-06-04 DIAGNOSIS — F17210 Nicotine dependence, cigarettes, uncomplicated: Secondary | ICD-10-CM | POA: Diagnosis not present

## 2021-06-04 DIAGNOSIS — F1290 Cannabis use, unspecified, uncomplicated: Secondary | ICD-10-CM | POA: Insufficient documentation

## 2021-06-04 HISTORY — PX: COLONOSCOPY WITH PROPOFOL: SHX5780

## 2021-06-04 SURGERY — COLONOSCOPY WITH PROPOFOL
Anesthesia: General

## 2021-06-04 MED ORDER — LACTATED RINGERS IV SOLN: INTRAVENOUS | Status: DC

## 2021-06-04 MED ORDER — PROPOFOL 500 MG/50ML IV EMUL
INTRAVENOUS | Status: DC | PRN
  Administered 2021-06-04: 150 ug/kg/min via INTRAVENOUS

## 2021-06-04 MED ORDER — LIDOCAINE HCL (CARDIAC) PF 100 MG/5ML IV SOSY
PREFILLED_SYRINGE | INTRAVENOUS | Status: DC | PRN
  Administered 2021-06-04: 50 mg via INTRAVENOUS

## 2021-06-04 MED ORDER — PROPOFOL 10 MG/ML IV BOLUS
INTRAVENOUS | Status: DC | PRN
  Administered 2021-06-04: 100 mg via INTRAVENOUS

## 2021-06-04 NOTE — Transfer of Care (Signed)
Immediate Anesthesia Transfer of Care Note ? ?Patient: [name] ? ?Procedure(s) Performed: COLONOSCOPY WITH PROPOFOL ? ?Patient Location: Endoscopy Unit ? ?Anesthesia Type:General ? ?Level of Consciousness: drowsy ? ?Airway & Oxygen Therapy: Patient Spontanous Breathing ? ?Post-op Assessment: Report given to RN and Post -op Vital signs reviewed and stable ? ?Post vital signs: Reviewed and stable ? ?Last Vitals:  ?Vitals Value Taken Time  ?BP    ?Temp    ?Pulse 84   ?Resp 14   ?SpO2 94%   ? ? ?Last Pain:  ?Vitals:  ? 06/04/21 0732  ?TempSrc:   ?PainSc: 0-No pain  ?   ? ?Patients Stated Pain Goal: 8 (06/04/21 0700) ? ?Complications: No notable events documented. ?

## 2021-06-04 NOTE — Op Note (Signed)
[facility] ?Patient Name: [name] ?Procedure Date: 06/04/2021 7:10 AM ?MRN: [id number] ?Date of Birth: 11-22-1985 ?Attending MD: Flint Melter Tollie Canada DO,  ?CSN: [id number] ?Age: 36 ?Admit Type: Outpatient ?Procedure:                Colonoscopy ?Indications:              Colitis, presumed infectious, Colitis with  ?                          microperforation ?Providers:                Flint Melter. Keaundre Thelin DO, Arva Chafe  ?                          Shanon Brow, Technician ?Referring MD:              ?Medicines:                See the Anesthesia note for documentation of the  ?                          administered medications ?Complications:            No immediate complications. Estimated blood loss:  ?                          None. ?Estimated Blood Loss:     Estimated blood loss: none. ?Procedure:                Pre-Anesthesia Assessment: ?                          - The risks and benefits of the procedure and the  ?                          sedation options and risks were discussed with the  ?                          patient. All questions were answered and informed  ?                          consent was obtained. ?                          - Patient identification and proposed procedure  ?                          were verified prior to the procedure by the  ?                          physician, the nurse, the anesthetist and the  ?                          technician. The procedure was verified in the  ?                          endoscopy suite. ?                          -  Pre-procedure physical examination revealed no  ?                          contraindications to sedation. ?                          - ASA Grade Assessment: II - A patient with mild  ?                          systemic disease. ?                          - The anesthesia plan was to use monitored  ?                          anesthesia care (MAC). ?                          After obtaining informed consent, the  colonoscope  ?                          was passed under direct vision. Throughout the  ?                          procedure, the patient's blood pressure, pulse, and  ?                          oxygen saturations were monitored continuously. The  ?                          [id number]) scope was introduced through the  ?                          anus and advanced to the the cecum, identified by  ?                          appendiceal orifice and ileocecal valve. The  ?                          ileocecal valve was photographed. The entire colon  ?                          was examined. The colonoscopy was performed without  ?                          difficulty. The patient tolerated the procedure  ?                          well. The quality of the bowel preparation was  ?                          evaluated using the BBPS [facility] Bowel Preparation  ?                          Scale) with scores  of: Right Colon = 2 (minor  ?                          amount of residual staining, small fragments of  ?                          stool and/or opaque liquid, but mucosa seen well),  ?                          Transverse Colon = 2 (minor amount of residual  ?                          staining, small fragments of stool and/or opaque  ?                          liquid, but mucosa seen well) and Left Colon = 1  ?                          (portion of mucosa seen, but other areas not well  ?                          seen due to staining, residual stool and/or opaque  ?                          liquid). The total BBPS score equals 5. The quality  ?                          of the bowel preparation was fair. Scope insertion  ?                          time was 4 minutes. Scope withdrawal time was 13  ?                          minutes. The total duration of the procedure was 17  ?                          minutes. ?Scope In: 7:36:00 AM ?Scope Out: 7:52:29 AM ?Scope Withdrawal Time: 0 hours 12 minutes 56 seconds  ?Total  Procedure Duration: 0 hours 16 minutes 29 seconds  ?Findings: ?     The perianal examination was normal. ?     The digital rectal exam was normal. Pertinent negatives include normal  ?     sphincter tone and no palpable rectal lesions. ?     A few small-mouthed diverticula were found in the distal sigmoid colon. ?     The exam was otherwise without abnormality on direct and retroflexion  ?     views. ?Impression:               - Preparation of the colon was fair. ?                          - Diverticulosis in the distal sigmoid colon. ?                          -  The examination was otherwise normal on direct  ?                          and retroflexion views. ?                          - No specimens collected. ?Moderate Sedation: ?     Per Anesthesia Care ?Recommendation:           - Discharge patient to home (ambulatory). ?                          - Patient has a contact number available for  ?                          emergencies. The signs and symptoms of potential  ?                          delayed complications were discussed with the  ?                          patient. Return to normal activities tomorrow.  ?                          Written discharge instructions were provided to the  ?                          patient. ?                          - Resume regular diet today. ?                          - Continue present medications. ?                          - Repeat colonoscopy in 10 years for screening  ?                          purposes. ?                          - Return to my office PRN. ?Procedure Code(s):        --- Professional --- ?                          [phone], Colonoscopy, flexible; diagnostic, including  ?                          collection of specimen(s) by brushing or washing,  ?                          when performed (separate procedure) ?Diagnosis Code(s):        --- Professional --- ?                          K52.9, Noninfective gastroenteritis and colitis,  ?  unspecified ?                          K57.30, Diverticulosis of large intestine without  ?                          perforation or abscess without bleeding ?CPT copyright 2019 [employer] Association. All rights reserved. ?The codes documented in this report are preliminary and upon coder review may  ?be revised to meet current compliance requirements. ?Flint Melter Donatella Walski DO,  ?06/04/2021 8:17:11 AM ?This report has been signed electronically. ?Number of Addenda: 0 ?

## 2021-06-04 NOTE — Discharge Instructions (Signed)
Discharge Instructions ? ?General Anesthesia or Sedation ?Do not drive or operate heavy machinery for 24 hours.  ?Do not consume alcohol, tranquilizers, sleeping medications, or any non-prescribed medications for 24 hours. ?Do not make important decisions or sign any important papers in the next 24 hours. ?You should have someone with you tonight at home. ? ?Activity ? You are advised to go directly home from the hospital.  Restrict your activities and rest for a day.  Resume activity tomorrow. ? ?Fluids and Diet ?Regular diet ? ?Contact Information: ?If you have questions or concerns, please call our office, [phone], Monday- Thursday 8AM-5PM and Friday 8AM-12Noon.  ?If it is after hours or on the weekend, please call [facility] Main [facility], [phone], and ask to speak to the surgeon on call for Dr. Robyne Peers at [facility].  ? ?SPECIFIC COMPLICATIONS TO WATCH FOR: ?Inability to urinate ?Fever over 101? F by mouth ?Nausea and vomiting lasting longer than 24 hours. ? ?

## 2021-06-04 NOTE — Interval H&P Note (Signed)
History and Physical Interval Note: ? ?06/04/2021 ?7:16 AM ? ?[name]  has presented today for surgery, with the diagnosis of DIAGNOSTIC COLONOSCOPY S/P COLITIS MICROPERFORATION.  The various methods of treatment have been discussed with the patient and family. After consideration of risks, benefits and other options for treatment, the patient has consented to  Procedure(s): ?COLONOSCOPY WITH PROPOFOL (N/A) as a surgical intervention.  The patient's history has been reviewed, patient examined, no change in status, stable for surgery.  I have reviewed the patient's chart and labs.  Questions were answered to the patient's satisfaction.   ? ? ?Nishi Neiswonger A Lorree Millar ? ? ?

## 2021-06-04 NOTE — Anesthesia Postprocedure Evaluation (Signed)
Anesthesia Post Note ? ?Patient: [name] ? ?Procedure(s) Performed: COLONOSCOPY WITH PROPOFOL ? ?Patient location during evaluation: Phase II ?Anesthesia Type: General ?Level of consciousness: awake ?Pain management: pain level controlled ?Vital Signs Assessment: post-procedure vital signs reviewed and stable ?Respiratory status: spontaneous breathing and respiratory function stable ?Cardiovascular status: blood pressure returned to baseline and stable ?Postop Assessment: no headache and no apparent nausea or vomiting ?Anesthetic complications: no ?Comments: Late entry ? ? ?No notable events documented. ? ? ?Last Vitals:  ?Vitals:  ? 06/04/21 0754 06/04/21 0802  ?BP: (!) 86/47 96/73  ?Pulse:    ?Resp: 14   ?Temp: (!) 36.4 ?C   ?SpO2: 94%   ?  ?Last Pain:  ?Vitals:  ? 06/04/21 0754  ?TempSrc: Oral  ?PainSc: 0-No pain  ? ? ?  ?  ?  ?  ?  ?  ? ?Windell Norfolk ? ? ? ? ?

## 2021-06-04 NOTE — Progress Notes (Signed)
Spoke with the patient and his mother.  I explained that I only found a few small diverticula.  I explained that I am not 100% sure what the cause of his colitis was, but there were no concerning masses or lesions.  He should plan to have a repeat colonoscopy in 10 years for screening.  Advised him to follow up with me as needed.  All questions were answered to their expressed satisfaction. ? ?Graciella Freer, DO ?[facility] ?[place], [place] [address] ?[phone] (office) ? ?

## 2021-06-04 NOTE — Anesthesia Preprocedure Evaluation (Signed)
Anesthesia Evaluation  ?Patient identified by MRN, date of birth, ID band ?Patient awake ? ? ? ?Reviewed: ?Allergy & Precautions, H&P , NPO status , Patient's Chart, lab work & pertinent test results, reviewed documented beta blocker date and time  ? ?Airway ?Mallampati: II ? ?TM Distance: >3 FB ?Neck ROM: full ? ? ? Dental ?no notable dental hx. ? ?  ?Pulmonary ?neg pulmonary ROS, Current Smoker,  ?  ?Pulmonary exam normal ?breath sounds clear to auscultation ? ? ? ? ? ? Cardiovascular ?Exercise Tolerance: Good ?negative cardio ROS ? ? ?Rhythm:regular Rate:Normal ? ? ?  ?Neuro/Psych ?PSYCHIATRIC DISORDERS Anxiety Depression Bipolar Disorder negative neurological ROS ?   ? GI/Hepatic ?negative GI ROS, Neg liver ROS,   ?Endo/Other  ?negative endocrine ROS ? Renal/GU ?negative Renal ROS  ?negative genitourinary ?  ?Musculoskeletal ? ? Abdominal ?  ?Peds ? Hematology ?negative hematology ROS ?(+)   ?Anesthesia Other Findings ? ? Reproductive/Obstetrics ?negative OB ROS ? ?  ? ? ? ? ? ? ? ? ? ? ? ? ? ?  ?  ? ? ? ? ? ? ? ? ?Anesthesia Physical ?Anesthesia Plan ? ?ASA: 3 ? ?Anesthesia Plan: General  ? ?Post-op Pain Management:   ? ?Induction:  ? ?PONV Risk Score and Plan: Propofol infusion ? ?Airway Management Planned:  ? ?Additional Equipment:  ? ?Intra-op Plan:  ? ?Post-operative Plan:  ? ?Informed Consent: I have reviewed the patients History and Physical, chart, labs and discussed the procedure including the risks, benefits and alternatives for the proposed anesthesia with the patient or authorized representative who has indicated his/her understanding and acceptance.  ? ? ? ?Dental Advisory Given ? ?Plan Discussed with: CRNA ? ?Anesthesia Plan Comments:   ? ? ? ? ? ? ?Anesthesia Quick Evaluation ? ?

## 2021-06-07 ENCOUNTER — Encounter (HOSPITAL_COMMUNITY): Payer: Self-pay | Admitting: Surgery

## 2021-08-06 ENCOUNTER — Emergency Department (HOSPITAL_COMMUNITY): Payer: Medicaid Other

## 2021-08-06 ENCOUNTER — Inpatient Hospital Stay (HOSPITAL_COMMUNITY)
Admission: EM | Admit: 2021-08-06 | Discharge: 2021-08-09 | DRG: 872 | Disposition: A | Discharge status: Home / Self Care | Payer: Medicaid Other | Attending: Internal Medicine | Admitting: Internal Medicine

## 2021-08-06 ENCOUNTER — Encounter (HOSPITAL_COMMUNITY): Payer: Self-pay

## 2021-08-06 ENCOUNTER — Other Ambulatory Visit: Payer: Self-pay

## 2021-08-06 DIAGNOSIS — Z28310 Unvaccinated for covid-19: Secondary | ICD-10-CM | POA: Diagnosis not present

## 2021-08-06 DIAGNOSIS — R1030 Lower abdominal pain, unspecified: Secondary | ICD-10-CM | POA: Diagnosis present

## 2021-08-06 DIAGNOSIS — F319 Bipolar disorder, unspecified: Secondary | ICD-10-CM | POA: Diagnosis present

## 2021-08-06 DIAGNOSIS — F310 Bipolar disorder, current episode hypomanic: Secondary | ICD-10-CM | POA: Diagnosis not present

## 2021-08-06 DIAGNOSIS — E7800 Pure hypercholesterolemia, unspecified: Secondary | ICD-10-CM | POA: Diagnosis not present

## 2021-08-06 DIAGNOSIS — Z720 Tobacco use: Secondary | ICD-10-CM

## 2021-08-06 DIAGNOSIS — K5720 Diverticulitis of large intestine with perforation and abscess without bleeding: Secondary | ICD-10-CM

## 2021-08-06 DIAGNOSIS — Z87820 Personal history of traumatic brain injury: Secondary | ICD-10-CM

## 2021-08-06 DIAGNOSIS — E785 Hyperlipidemia, unspecified: Secondary | ICD-10-CM | POA: Diagnosis present

## 2021-08-06 DIAGNOSIS — Z79899 Other long term (current) drug therapy: Secondary | ICD-10-CM | POA: Diagnosis not present

## 2021-08-06 DIAGNOSIS — A419 Sepsis, unspecified organism: Secondary | ICD-10-CM | POA: Diagnosis present

## 2021-08-06 DIAGNOSIS — F419 Anxiety disorder, unspecified: Secondary | ICD-10-CM | POA: Diagnosis present

## 2021-08-06 DIAGNOSIS — F17210 Nicotine dependence, cigarettes, uncomplicated: Secondary | ICD-10-CM | POA: Diagnosis present

## 2021-08-06 LAB — CBC
HCT: 46.5 % (ref 39.0–52.0)
Hemoglobin: 15.5 g/dL (ref 13.0–17.0)
MCH: 31.3 pg (ref 26.0–34.0)
MCHC: 33.3 g/dL (ref 30.0–36.0)
MCV: 93.8 fL (ref 80.0–100.0)
Platelets: 236 10*3/uL (ref 150–400)
RBC: 4.96 MIL/uL (ref 4.22–5.81)
RDW: 13.4 % (ref 11.5–15.5)
WBC: 16.2 10*3/uL — ABNORMAL HIGH (ref 4.0–10.5)
nRBC: 0 % (ref 0.0–0.2)

## 2021-08-06 LAB — COMPREHENSIVE METABOLIC PANEL
ALT: 15 U/L (ref 0–44)
AST: 15 U/L (ref 15–41)
Albumin: 3.7 g/dL (ref 3.5–5.0)
Alkaline Phosphatase: 56 U/L (ref 38–126)
Anion gap: 6 (ref 5–15)
BUN: 6 mg/dL (ref 6–20)
CO2: 27 mmol/L (ref 22–32)
Calcium: 8.6 mg/dL — ABNORMAL LOW (ref 8.9–10.3)
Chloride: 103 mmol/L (ref 98–111)
Creatinine, Ser: 0.86 mg/dL (ref 0.61–1.24)
GFR, Estimated: >60 mL/min (ref >60)
Glucose, Bld: 101 mg/dL — ABNORMAL HIGH (ref 70–99)
Potassium: 3.6 mmol/L (ref 3.5–5.1)
Sodium: 136 mmol/L (ref 135–145)
Total Bilirubin: 0.3 mg/dL (ref 0.3–1.2)
Total Protein: 7.3 g/dL (ref 6.5–8.1)

## 2021-08-06 LAB — URINALYSIS, ROUTINE W REFLEX MICROSCOPIC
Bacteria, UA: NONE SEEN
Bilirubin Urine: NEGATIVE
Glucose, UA: NEGATIVE mg/dL
Hgb urine dipstick: NEGATIVE
Ketones, ur: 20 mg/dL — AB
Leukocytes,Ua: NEGATIVE
Nitrite: NEGATIVE
Protein, ur: 30 mg/dL — AB
Specific Gravity, Urine: 1.024 (ref 1.005–1.030)
pH: 7 (ref 5.0–8.0)

## 2021-08-06 LAB — PROTIME-INR
INR: 1.1 (ref 0.8–1.2)
Prothrombin Time: 14.3 seconds (ref 11.4–15.2)

## 2021-08-06 LAB — CREATININE, SERUM
Creatinine, Ser: 0.9 mg/dL (ref 0.61–1.24)
GFR, Estimated: >60 mL/min (ref >60)

## 2021-08-06 LAB — APTT: aPTT: 26 seconds (ref 24–36)

## 2021-08-06 LAB — LIPASE, BLOOD: Lipase: 23 U/L (ref 11–51)

## 2021-08-06 LAB — LACTIC ACID, PLASMA: Lactic Acid, Venous: 0.9 mmol/L (ref 0.5–1.9)

## 2021-08-06 MED ORDER — KCL IN DEXTROSE-NACL 20-5-0.9 MEQ/L-%-% IV SOLN
INTRAVENOUS | Status: DC
  Filled 2021-08-06 (×3): qty 1000

## 2021-08-06 MED ORDER — ONDANSETRON HCL 4 MG PO TABS: 4.0000 mg | ORAL_TABLET | Freq: Four times a day (QID) | ORAL | Status: DC | PRN

## 2021-08-06 MED ORDER — SODIUM CHLORIDE 0.9 % IV SOLN: INTRAVENOUS | Status: AC

## 2021-08-06 MED ORDER — PIPERACILLIN-TAZOBACTAM 3.375 G IVPB 30 MIN
3.3750 g | Freq: Once | INTRAVENOUS | Status: AC
Start: 2021-08-06 — End: 2021-08-06
  Administered 2021-08-06: 3.375 g via INTRAVENOUS
  Filled 2021-08-06: qty 50

## 2021-08-06 MED ORDER — ACETAMINOPHEN 325 MG PO TABS
650.0000 mg | ORAL_TABLET | Freq: Four times a day (QID) | ORAL | Status: DC | PRN
  Administered 2021-08-07 – 2021-08-08 (×6): 650 mg via ORAL
  Filled 2021-08-06 (×7): qty 2

## 2021-08-06 MED ORDER — ONDANSETRON HCL 4 MG/2ML IJ SOLN
4.0000 mg | Freq: Four times a day (QID) | INTRAMUSCULAR | Status: DC | PRN
  Administered 2021-08-07: 4 mg via INTRAVENOUS
  Filled 2021-08-06: qty 2

## 2021-08-06 MED ORDER — IOHEXOL 300 MG/ML  SOLN
100.0000 mL | Freq: Once | INTRAMUSCULAR | Status: AC | PRN
  Administered 2021-08-06: 100 mL via INTRAVENOUS

## 2021-08-06 MED ORDER — PIPERACILLIN-TAZOBACTAM 3.375 G IVPB
3.3750 g | Freq: Three times a day (TID) | INTRAVENOUS | Status: DC
  Administered 2021-08-06 – 2021-08-09 (×8): 3.375 g via INTRAVENOUS
  Filled 2021-08-06 (×8): qty 50

## 2021-08-06 MED ORDER — DIVALPROEX SODIUM 250 MG PO DR TAB
500.0000 mg | DELAYED_RELEASE_TABLET | Freq: Every day | ORAL | Status: DC
  Administered 2021-08-06 – 2021-08-08 (×3): 500 mg via ORAL
  Filled 2021-08-06 (×3): qty 2

## 2021-08-06 MED ORDER — HEPARIN SODIUM (PORCINE) 5000 UNIT/ML IJ SOLN
5000.0000 [IU] | Freq: Three times a day (TID) | INTRAMUSCULAR | Status: DC
Start: 2021-08-06 — End: 2021-08-09
  Administered 2021-08-06 – 2021-08-09 (×9): 5000 [IU] via SUBCUTANEOUS
  Filled 2021-08-06 (×9): qty 1

## 2021-08-06 MED ORDER — ACETAMINOPHEN 650 MG RE SUPP: 650.0000 mg | Freq: Four times a day (QID) | RECTAL | Status: DC | PRN

## 2021-08-06 MED ORDER — NICOTINE 21 MG/24HR TD PT24
21.0000 mg | MEDICATED_PATCH | Freq: Every day | TRANSDERMAL | Status: DC
  Administered 2021-08-06 – 2021-08-09 (×4): 21 mg via TRANSDERMAL
  Filled 2021-08-06 (×4): qty 1

## 2021-08-06 MED ORDER — SODIUM CHLORIDE 0.9 % IV SOLN
INTRAVENOUS | Status: DC
Start: 2021-08-06 — End: 2021-08-06

## 2021-08-06 MED ORDER — MORPHINE SULFATE (PF) 4 MG/ML IV SOLN
4.0000 mg | Freq: Once | INTRAVENOUS | Status: AC
  Administered 2021-08-06: 4 mg via INTRAVENOUS
  Filled 2021-08-06: qty 1

## 2021-08-06 MED ORDER — LACTATED RINGERS IV SOLN: INTRAVENOUS | Status: DC

## 2021-08-06 MED ORDER — HALOPERIDOL 5 MG PO TABS
10.0000 mg | ORAL_TABLET | Freq: Every day | ORAL | Status: DC
  Administered 2021-08-06 – 2021-08-08 (×3): 10 mg via ORAL
  Filled 2021-08-06 (×3): qty 2

## 2021-08-06 MED ORDER — ONDANSETRON HCL 4 MG/2ML IJ SOLN
4.0000 mg | Freq: Once | INTRAMUSCULAR | Status: AC
  Administered 2021-08-06: 4 mg via INTRAVENOUS
  Filled 2021-08-06: qty 2

## 2021-08-06 MED ORDER — ATORVASTATIN CALCIUM 10 MG PO TABS
10.0000 mg | ORAL_TABLET | Freq: Every day | ORAL | Status: DC
  Administered 2021-08-06 – 2021-08-08 (×3): 10 mg via ORAL
  Filled 2021-08-06 (×3): qty 1

## 2021-08-06 MED ORDER — HYDROMORPHONE HCL 1 MG/ML IJ SOLN
0.5000 mg | INTRAMUSCULAR | Status: DC | PRN
  Administered 2021-08-06: 0.5 mg via INTRAVENOUS
  Administered 2021-08-06 (×2): 1 mg via INTRAVENOUS
  Administered 2021-08-07 – 2021-08-08 (×6): 0.5 mg via INTRAVENOUS
  Administered 2021-08-08: 1 mg via INTRAVENOUS
  Filled 2021-08-06: qty 0.5
  Filled 2021-08-06: qty 1
  Filled 2021-08-06 (×2): qty 0.5
  Filled 2021-08-06: qty 1
  Filled 2021-08-06 (×2): qty 0.5
  Filled 2021-08-06: qty 1
  Filled 2021-08-06: qty 0.5
  Filled 2021-08-06: qty 1
  Filled 2021-08-06: qty 0.5

## 2021-08-06 MED ORDER — CLONAZEPAM 0.5 MG PO TABS
0.5000 mg | ORAL_TABLET | Freq: Every day | ORAL | Status: DC
  Administered 2021-08-06 – 2021-08-08 (×3): 0.5 mg via ORAL
  Filled 2021-08-06 (×3): qty 1

## 2021-08-06 MED ORDER — HYDROXYZINE HCL 25 MG PO TABS
25.0000 mg | ORAL_TABLET | Freq: Every evening | ORAL | Status: DC | PRN
Start: 2021-08-06 — End: 2021-08-09
  Administered 2021-08-06 – 2021-08-08 (×2): 25 mg via ORAL
  Filled 2021-08-06 (×2): qty 1

## 2021-08-06 MED ORDER — LORAZEPAM 2 MG/ML IJ SOLN: 1.0000 mg | Freq: Two times a day (BID) | INTRAMUSCULAR | Status: DC | PRN

## 2021-08-06 MED ORDER — POTASSIUM CHLORIDE CRYS ER 20 MEQ PO TBCR
40.0000 meq | EXTENDED_RELEASE_TABLET | Freq: Once | ORAL | Status: AC
  Administered 2021-08-06: 40 meq via ORAL
  Filled 2021-08-06: qty 2

## 2021-08-06 NOTE — ED Provider Notes (Signed)
Patient has a [facility] Provider Note   CSN: [id number] Arrival date & time: 08/06/21  [id number]     History  Chief Complaint  Patient presents with   Abdominal Pain    [name] is a 36 y.o. male.   Abdominal Pain  This patient is a 36 year old male with a history of a traumatic brain injury, he has a history of bipolar disorder, he takes Haldol, clonazepam, Depakote.  He presents to the hospital in the care of his mother who is his healthcare power of attorney, a known history of a prior diverticular perforation in January, he was admitted to the hospital for about a week and treated conservatively without surgery.  In March she had a follow-up colonoscopy, I have reviewed the notes and it appears that he had a normal colonoscopy with some scattered diverticula but no acute inflammation or abnormalities.  The patient reports over the last 2 days he has developed recurrent discomfort in the left lower quadrant and now has a feeling of myalgias, diffuse systemic illness and nausea.  He denies having any fevers, the pain is currently 6 out of 10.  No changes in bowel movements, no changes in urination.  No medications given prior to arrival.  Home Medications Prior to Admission medications   Medication Sig Start Date End Date Taking? Authorizing Provider  acetaminophen (TYLENOL) 500 MG tablet Take 1,000 mg by mouth every 6 (six) hours as needed for mild pain or fever.   Yes [provider]  atorvastatin (LIPITOR) 10 MG tablet Take 10 mg by mouth at bedtime. 06/01/21  Yes [provider]  clonazePAM (KLONOPIN) 0.5 MG tablet Take 0.5 mg by mouth daily as needed for anxiety. 03/30/21  Yes [provider]  divalproex (DEPAKOTE) 500 MG DR tablet Take 500 mg by mouth at bedtime. 03/27/21  Yes [provider]  haloperidol (HALDOL) 10 MG tablet Take 10 mg by mouth See admin instructions. Take 1 and 1/2 tablets every evening 03/15/21  Yes  [provider]  hydrOXYzine (ATARAX) 25 MG tablet Take 25 mg by mouth at bedtime as needed. 07/29/21  Yes [provider]  Sodium Sulfate-Mag Sulfate-KCl ([place]) [id number] MG TABS Take as directed based on form received in office. Patient not taking: Reported on 08/06/2021 05/14/21   Pappayliou, Barnetta Chapel A, DO      Allergies    Patient has no known allergies.    Review of Systems   Review of Systems  Gastrointestinal:  Positive for abdominal pain.  All other systems reviewed and are negative.  Physical Exam Updated Vital Signs BP 108/76   Pulse (!) 119   Temp 99.5 F (37.5 C) (Oral)   Resp 20   SpO2 96%  Physical Exam Vitals and nursing note reviewed.  Constitutional:      General: He is not in acute distress.    Appearance: He is well-developed.  HENT:     Head: Normocephalic and atraumatic.     Mouth/Throat:     Pharynx: No oropharyngeal exudate.  Eyes:     General: No scleral icterus.       Right eye: No discharge.        Left eye: No discharge.     Conjunctiva/sclera: Conjunctivae normal.     Pupils: Pupils are equal, round, and reactive to light.  Neck:     Thyroid: No thyromegaly.     Vascular: No JVD.  Cardiovascular:     Rate and Rhythm: Regular  rhythm. Tachycardia present.     Heart sounds: Normal heart sounds. No murmur heard.   No friction rub. No gallop.  Pulmonary:     Effort: Pulmonary effort is normal. No respiratory distress.     Breath sounds: Normal breath sounds. No wheezing or rales.  Abdominal:     General: Bowel sounds are normal. There is no distension.     Palpations: Abdomen is soft. There is no mass.     Tenderness: There is abdominal tenderness.     Comments: Left lower quadrant tenderness, no guarding or peritoneal signs  Musculoskeletal:        General: No tenderness. Normal range of motion.     Cervical back: Normal range of motion and neck supple.     Right lower leg: No edema.     Left lower leg: No edema.   Lymphadenopathy:     Cervical: No cervical adenopathy.  Skin:    General: Skin is warm and dry.     Findings: No erythema or rash.  Neurological:     Mental Status: He is alert.     Coordination: Coordination normal.  Psychiatric:        Behavior: Behavior normal.    ED Results / Procedures / Treatments   Labs (all labs ordered are listed, but only abnormal results are displayed) Labs Reviewed  COMPREHENSIVE METABOLIC PANEL - Abnormal; Notable for the following components:      Result Value   Glucose, Bld 101 (*)    Calcium 8.6 (*)    All other components within normal limits  CBC - Abnormal; Notable for the following components:   WBC 16.2 (*)    All other components within normal limits  URINALYSIS, ROUTINE W REFLEX MICROSCOPIC - Abnormal; Notable for the following components:   Ketones, ur 20 (*)    Protein, ur 30 (*)    All other components within normal limits  LIPASE, BLOOD  LACTIC ACID, PLASMA  LACTIC ACID, PLASMA    EKG EKG Interpretation  Date/Time:  Tuesday Aug 06 2021 10:19:08 EDT Ventricular Rate:  140 PR Interval:  104 QRS Duration: 84 QT Interval:  364 QTC Calculation: 555 R Axis:   100 Text Interpretation:  Critical Test Result: Long QTc Sinus tachycardia with short PR Rightward axis Borderline ECG When compared with ECG of 03-Apr-2021 18:11, PR interval has decreased Criteria for Septal infarct are no longer Present Confirmed by Noemi Chapel [phone]) on 08/06/2021 12:14:43 PM  Radiology CT ABDOMEN PELVIS W CONTRAST  Result Date: 08/06/2021 CLINICAL DATA:  Left lower quadrant abdominal pain EXAM: CT ABDOMEN AND PELVIS WITH CONTRAST TECHNIQUE: Multidetector CT imaging of the abdomen and pelvis was performed using the standard protocol following bolus administration of intravenous contrast. RADIATION DOSE REDUCTION: This exam was performed according to the departmental dose-optimization program which includes automated exposure control, adjustment of the  mA and/or kV according to patient size and/or use of iterative reconstruction technique. CONTRAST:  163mL OMNIPAQUE IOHEXOL 300 MG/ML  SOLN COMPARISON:  04/01/2021 FINDINGS: Lower chest: No acute abnormality. Hepatobiliary: No focal liver abnormality is seen. No gallstones, gallbladder wall thickening, or biliary dilatation. Pancreas: Unremarkable. No pancreatic ductal dilatation or surrounding inflammatory changes. Spleen: Normal in size without focal abnormality. Adrenals/Urinary Tract: Unremarkable adrenal glands. Kidneys enhance symmetrically without focal lesion, stone, or hydronephrosis. Ureters are nondilated. Urinary bladder is decompressed, limiting its evaluation. Stomach/Bowel: Stomach within normal limits. No dilated loops of bowel. Focally thickened, inflamed segment of distal descending colon containing inflamed appearing  diverticula. Multiple foci of extraluminal air adjacent to the segment compatible with a contained micro perforation (series 2, image 72). Extensive surrounding pericolonic fat stranding and a small amount of fluid within the left pericolic gutter. No well-defined or rim enhancing fluid collection is present at this time. Mild wall thickening within the mid sigmoid colon at site of previously seen inflammation without associated inflammatory changes, likely related to chronic diverticulitis. No additional sites of focal bowel wall thickening. Vascular/Lymphatic: No significant vascular findings are present. No enlarged abdominal or pelvic lymph nodes. Reproductive: Prostate is unremarkable. Other: No significant volume of pneumoperitoneum within the abdomen. No abdominal wall hernia. Musculoskeletal: No acute or significant osseous findings. IMPRESSION: 1. Acute diverticulitis of the distal descending colon with evidence of a contained micro perforation. Adjacent ill-defined free fluid, however no well-defined or rim-enhancing fluid collection is present at this time. 2. Mild wall  thickening within the mid sigmoid colon at site of previously seen inflammation without associated inflammatory changes, likely related to chronic diverticulitis. These results were called by telephone at the time of interpretation on 08/06/2021 at 12:03 pm to provider [facility] , who verbally acknowledged these results. Electronically Signed   By: Davina Poke D.O.   On: 08/06/2021 12:08    Procedures .Critical Care Performed by: Noemi Chapel, MD Authorized by: Noemi Chapel, MD   Critical care provider statement:    Critical care time (minutes):  30   Critical care time was exclusive of:  Separately billable procedures and treating other patients and teaching time   Critical care was necessary to treat or prevent imminent or life-threatening deterioration of the following conditions:  Sepsis   Critical care was time spent personally by me on the following activities:  Development of treatment plan with patient or surrogate, discussions with consultants, evaluation of patient's response to treatment, examination of patient, ordering and review of laboratory studies, ordering and review of radiographic studies, ordering and performing treatments and interventions, pulse oximetry, re-evaluation of patient's condition, review of old charts and obtaining history from patient or surrogate   I assumed direction of critical care for this patient from another provider in my specialty: no     Care discussed with: admitting provider   Comments:          Medications Ordered in ED Medications  0.9 %  sodium chloride infusion ( Intravenous New Bag/Given 08/06/21 1100)  piperacillin-tazobactam (ZOSYN) IVPB 3.375 g (has no administration in time range)  morphine (PF) 4 MG/ML injection 4 mg (4 mg Intravenous Given 08/06/21 1058)  ondansetron (ZOFRAN) injection 4 mg (4 mg Intravenous Given 08/06/21 1058)  iohexol (OMNIPAQUE) 300 MG/ML solution 100 mL (100 mLs Intravenous Contrast Given 08/06/21 1148)     ED Course/ Medical Decision Making/ A&P                           Medical Decision Making Amount and/or Complexity of Data Reviewed Labs: ordered. Radiology: ordered.  Risk Prescription drug management. Decision regarding hospitalization.   This patient presents to the ED for concern of abdominal pain, this involves an extensive number of treatment options, and is a complaint that carries with it a high risk of complications and morbidity.  The differential diagnosis includes recurrent diverticulitis, recurrent perforation, abscess   Co morbidities that complicate the patient evaluation  Traumatic brain injury High cholesterol Recent previous severe diverticulosis and diverticulitis with perforation   Additional history obtained:  Additional history obtained from  electronic medical record External records from outside source obtained and reviewed including CT scan in January, colonoscopy in March   Lab Tests:  I Ordered, and personally interpreted labs.  The pertinent results include: CBC is elevated with a white blood cell count of [id number], metabolic panel is reassuring without signs of renal dysfunction, lactate of 0.9 urinalysis clean other than ketones   Imaging Studies ordered:  I ordered imaging studies including CT scan of the abdomen and pelvis I independently visualized and interpreted imaging which showed perforated descending colitis, this is a different location than the prior colitis I agree with the radiologist interpretation   Cardiac Monitoring: / EKG:  The patient was maintained on a cardiac monitor.  I personally viewed and interpreted the cardiac monitored which showed an underlying rhythm of: Sinus tachycardia   Consultations Obtained:  I requested consultation with the general surgery (Dr. Mamie Nick) and hospitalist,  and discussed lab and imaging findings as well as pertinent plan - they recommend: Admission and antibiotics   Problem List / ED  Course / Critical interventions / Medication management  Perforated viscus, patient critically ill with a potentially life-threatening illness, needs antibiotics and admission to the hospital I ordered medication including antibiotics including Zosyn and IV fluids for what appears to be sepsis Reevaluation of the patient after these medicines showed that the patient stabilized I have reviewed the patients home medicines and have made adjustments as needed   Social Determinants of Health:  Perforated viscus   Test / Admission - Considered:  We will admit to the hospital         Final Clinical Impression(s) / ED Diagnoses Final diagnoses:  Perforated diverticulum of large intestine  Sepsis, due to unspecified organism, unspecified whether acute organ dysfunction present [facility])     Noemi Chapel, MD 08/06/21 1215

## 2021-08-06 NOTE — Assessment & Plan Note (Signed)
continue lipitor ?

## 2021-08-06 NOTE — Consult Note (Signed)
[facility] Consult  Reason for Consult: Diverticulitis with contained microperforation Referring Physician: Dr. Hyacinth Meeker  Chief Complaint   Abdominal Pain     HPI: [name] is a 36 y.o. male who presents with a 3-day history of lower abdominal pain.  He states that the pain started after he strained to have a bowel movement.  This pain is similar to the pain that he experienced the last time he was in the hospital with colitis, however it is not quite as bad.  He denies any fevers, but confirms feeling chilled.  He did have some associated nausea, but denies episodes of emesis.  He last had a bowel movement yesterday, and has not passed flatus since this morning.  He is only complaint at this time is that he is thirsty and would like something to drink.  He had a recent episode of colitis in January of this year.  He also underwent a colonoscopy with me on 3/28, which demonstrated some small diverticula but no other abnormalities.  He has no history of abdominal surgeries.  He denies use of blood thinning medications.  In the ED, he underwent a CT abdomen pelvis which demonstrated acute diverticulitis of the distal descending colon with a contained microperforation, small amount of free fluid without rim-enhancing fluid collection, mild wall thickening within the mid sigmoid colon at the site of previous inflammation without associated inflammatory changes, thought to be chronic diverticulitis.  He had a leukocytosis today of 16.2.  His other blood work is within normal limits.  He was noted to be tachycardic, but again this patient has chronic tachycardia at baseline.  Past Medical History:  Diagnosis Date   Anxiety    Bipolar 1 disorder (HCC)    Brain injury [facility])     Past Surgical History:  Procedure Laterality Date   ANKLE SURGERY     COLONOSCOPY WITH PROPOFOL N/A 06/04/2021   Procedure: COLONOSCOPY WITH PROPOFOL;  Surgeon: Lewie Chamber, DO;   [facility];  Service: General;  Laterality: N/A;   FRACTURE SURGERY      No family history on file.  Social History   Tobacco Use   Smoking status: Every Day    Packs/day: 1.00    Years: 4.00    Pack years: 4.00    Types: Cigarettes  Vaping Use   Vaping Use: Every day  Substance Use Topics   Alcohol use: No   Drug use: Yes    Types: Marijuana    Comment: 3 days ago    Medications: I have reviewed the patient's current medications.  No Known Allergies   ROS:  Constitutional: negative for chills, fatigue, and fevers Respiratory: negative for cough and shortness of breath Cardiovascular: negative for chest pain and palpitations Gastrointestinal: positive for abdominal pain and nausea, negative for reflux symptoms and vomiting Musculoskeletal:negative for back pain and neck pain  Blood pressure 94/60, pulse (!) 110, temperature 98.3 F (36.8 C), temperature source Oral, resp. rate 18, SpO2 100 %. Physical Exam Vitals reviewed.  Constitutional:      Appearance: He is well-developed.  HENT:     Head: Normocephalic and atraumatic.  Eyes:     Extraocular Movements: Extraocular movements intact.     Pupils: Pupils are equal, round, and reactive to light.  Cardiovascular:     Rate and Rhythm: Tachycardia present.  Pulmonary:     Effort: Pulmonary effort is normal.  Abdominal:     Comments: Abdomen soft, nondistended, no percussion  tenderness, tenderness to palpation in the left lower quadrant; no rigidity, guarding, rebound tenderness  Skin:    General: Skin is warm and dry.  Neurological:     General: No focal deficit present.     Mental Status: He is alert and oriented to person, place, and time.  Psychiatric:        Mood and Affect: Mood normal.        Behavior: Behavior normal.    Results: Results for orders placed or performed during the hospital encounter of 08/06/21 (from the past 48 hour(s))  Urinalysis, Routine w reflex microscopic Urine,  Clean Catch     Status: Abnormal   Collection Time: 08/06/21 10:16 AM  Result Value Ref Range   Color, Urine YELLOW YELLOW   APPearance CLEAR CLEAR   Specific Gravity, Urine 1.024 1.005 - 1.030   pH 7.0 5.0 - 8.0   Glucose, UA NEGATIVE NEGATIVE mg/dL   Hgb urine dipstick NEGATIVE NEGATIVE   Bilirubin Urine NEGATIVE NEGATIVE   Ketones, ur 20 (A) NEGATIVE mg/dL   Protein, ur 30 (A) NEGATIVE mg/dL   Nitrite NEGATIVE NEGATIVE   Leukocytes,Ua NEGATIVE NEGATIVE   RBC / HPF 0-5 0 - 5 RBC/hpf   WBC, UA 0-5 0 - 5 WBC/hpf   Bacteria, UA NONE SEEN NONE SEEN   Mucus PRESENT     Comment: Performed at [facility], [address], [place], [place] [address]  Lipase, blood     Status: None   Collection Time: 08/06/21 10:57 AM  Result Value Ref Range   Lipase 23 11 - 51 U/L    Comment: Performed at [facility], [address], [place], [place] [address]  Comprehensive metabolic panel     Status: Abnormal   Collection Time: 08/06/21 10:57 AM  Result Value Ref Range   Sodium 136 135 - 145 mmol/L   Potassium 3.6 3.5 - 5.1 mmol/L   Chloride 103 98 - 111 mmol/L   CO2 27 22 - 32 mmol/L   Glucose, Bld 101 (H) 70 - 99 mg/dL    Comment: Glucose reference range applies only to samples taken after fasting for at least 8 hours.   BUN 6 6 - 20 mg/dL   Creatinine, Ser 0.98 0.61 - 1.24 mg/dL   Calcium 8.6 (L) 8.9 - 10.3 mg/dL   Total Protein 7.3 6.5 - 8.1 g/dL   Albumin 3.7 3.5 - 5.0 g/dL   AST 15 15 - 41 U/L   ALT 15 0 - 44 U/L   Alkaline Phosphatase 56 38 - 126 U/L   Total Bilirubin 0.3 0.3 - 1.2 mg/dL   GFR, Estimated >11 >91 mL/min    Comment: (NOTE) Calculated using the CKD-EPI Creatinine Equation (2021)    Anion gap 6 5 - 15    Comment: Performed at [facility], [address], [place], [place] [address]  CBC     Status: Abnormal   Collection Time: 08/06/21 10:57 AM  Result Value Ref Range   WBC 16.2 (H) 4.0 - 10.5 K/uL   RBC 4.96 4.22 - 5.81 MIL/uL   Hemoglobin 15.5 13.0 - 17.0 g/dL    HCT 56.2 13.0 - 86.5 %   MCV 93.8 80.0 - 100.0 fL   MCH 31.3 26.0 - 34.0 pg   MCHC 33.3 30.0 - 36.0 g/dL   RDW 78.4 69.6 - 29.5 %   Platelets 236 150 - 400 K/uL   nRBC 0.0 0.0 - 0.2 %    Comment: Performed at  [facility], [address] [place], [place] [address]  Lactic acid, plasma     Status: None   Collection Time: 08/06/21 10:57 AM  Result Value Ref Range   Lactic Acid, Venous 0.9 0.5 - 1.9 mmol/L    Comment: Performed at [facility], [address] [place], [place] [address]  Protime-INR     Status: None   Collection Time: 08/06/21 12:54 PM  Result Value Ref Range   Prothrombin Time 14.3 11.4 - 15.2 seconds   INR 1.1 0.8 - 1.2    Comment: (NOTE) INR goal varies based on device and disease states. Performed at [facility], [address] [place], [place] [address]   APTT     Status: None   Collection Time: 08/06/21 12:54 PM  Result Value Ref Range   aPTT 26 24 - 36 seconds    Comment: Performed at [facility], [address] [place], [place] [address]  Blood Culture (routine x 2)     Status: None (Preliminary result)   Collection Time: 08/06/21 12:54 PM   Specimen: BLOOD RIGHT ARM  Result Value Ref Range   Specimen Description BLOOD RIGHT ARM    Special Requests      BOTTLES DRAWN AEROBIC AND ANAEROBIC Blood Culture adequate volume Performed at [facility], [address], [place], [place] [address]    Culture PENDING    Report Status PENDING   Blood Culture (routine x 2)     Status: None (Preliminary result)   Collection Time: 08/06/21 12:54 PM   Specimen: BLOOD LEFT HAND  Result Value Ref Range   Specimen Description BLOOD LEFT HAND    Special Requests      BOTTLES DRAWN AEROBIC AND ANAEROBIC Blood Culture adequate volume Performed at [facility], [address] [place], [place] [address]    Culture PENDING    Report Status PENDING   Creatinine, serum     Status: None   Collection Time: 08/06/21 12:54 PM  Result Value Ref Range   Creatinine, Ser 0.90  0.61 - 1.24 mg/dL   GFR, Estimated >50 >09 mL/min    Comment: (NOTE) Calculated using the CKD-EPI Creatinine Equation (2021) Performed at [facility], [address] [place], [place] [address]     CT ABDOMEN PELVIS W CONTRAST  Result Date: 08/06/2021 CLINICAL DATA:  Left lower quadrant abdominal pain EXAM: CT ABDOMEN AND PELVIS WITH CONTRAST TECHNIQUE: Multidetector CT imaging of the abdomen and pelvis was performed using the standard protocol following bolus administration of intravenous contrast. RADIATION DOSE REDUCTION: This exam was performed according to the departmental dose-optimization program which includes automated exposure control, adjustment of the mA and/or kV according to patient size and/or use of iterative reconstruction technique. CONTRAST:  OMNIPAQUE IOHEXOL 300 MG/ML  SOLN COMPARISON:  04/01/2021 FINDINGS: Lower chest: No acute abnormality. Hepatobiliary: No focal liver abnormality is seen. No gallstones, gallbladder wall thickening, or biliary dilatation. Pancreas: Unremarkable. No pancreatic ductal dilatation or surrounding inflammatory changes. Spleen: Normal in size without focal abnormality. Adrenals/Urinary Tract: Unremarkable adrenal glands. Kidneys enhance symmetrically without focal lesion, stone, or hydronephrosis. Ureters are nondilated. Urinary bladder is decompressed, limiting its evaluation. Stomach/Bowel: Stomach within normal limits. No dilated loops of bowel. Focally thickened, inflamed segment of distal descending colon containing inflamed appearing diverticula. Multiple foci of extraluminal air adjacent to the segment compatible with a contained micro perforation (series 2, image 72). Extensive surrounding pericolonic fat stranding and a small amount of fluid within the left pericolic gutter. No well-defined  or rim enhancing fluid collection is present at this time. Mild wall thickening within the mid sigmoid colon at site of previously seen inflammation  without associated inflammatory changes, likely related to chronic diverticulitis. No additional sites of focal bowel wall thickening. Vascular/Lymphatic: No significant vascular findings are present. No enlarged abdominal or pelvic lymph nodes. Reproductive: Prostate is unremarkable. Other: No significant volume of pneumoperitoneum within the abdomen. No abdominal wall hernia. Musculoskeletal: No acute or significant osseous findings. IMPRESSION: 1. Acute diverticulitis of the distal descending colon with evidence of a contained micro perforation. Adjacent ill-defined free fluid, however no well-defined or rim-enhancing fluid collection is present at this time. 2. Mild wall thickening within the mid sigmoid colon at site of previously seen inflammation without associated inflammatory changes, likely related to chronic diverticulitis. These results were called by telephone at the time of interpretation on 08/06/2021 at 12:03 pm to provider [facility] , who verbally acknowledged these results. Electronically Signed   By: Duanne GuessNicholas  Plundo D.O.   On: 08/06/2021 12:08   DG Chest Port 1 View  Result Date: 08/06/2021 CLINICAL DATA:  Question sepsis EXAM: PORTABLE CHEST 1 VIEW COMPARISON:  None Available. FINDINGS: Heart size and vascularity normal. Lungs clear without infiltrate or effusion. No acute skeletal abnormality. IMPRESSION: No active disease. Electronically Signed   By: Marlan Palauharles  Clark M.D.   On: 08/06/2021 12:50     Assessment & Plan:  Chancy MilroyJonathan A Mccann is a 36 y.o. male who presents to the emergency department with lower abdominal pain.  CT abdomen and pelvis demonstrates acute diverticulitis of the distal descending colon with contained microperforation.  WBC 16.2.  Imaging and blood work evaluated by myself.  -I discussed the disease pathology and treatment options at this time -Discussed nonoperative management versus operative management.  Given his benign abdominal exam, will opt for  nonoperative management at this time (NPO, IV antibiotics, pain control) -I did reiterate the potential need for surgical intervention if the patient begins to worsen while inpatient -Given that he had a recent episode of colitis and a subsequent colonoscopy, he will not require colonoscopy after discharge -I did discuss that we may need to consider elective colectomy if he continues to have episodes of colitis and diverticulitis -Patient is tachycardic, will continue to monitor, but patient has chronic tachycardia baseline -IV Zosyn -NPO, okay for sips with meds and occasional ice chips -As needed pain control and antiemetics -Repeat blood work in the morning -Appreciate hospitalist recommendations  All questions were answered to the satisfaction of the patient and family.  -- Theophilus Kindsatherine Metztli Sachdev, DO [facility] [address] [place], [place] [id number] [phone] (office)

## 2021-08-06 NOTE — Progress Notes (Signed)
Elink following code sepsis °

## 2021-08-06 NOTE — H&P (Signed)
History and Physical    Patient: [name] ERD:[id number] DOB: August 26, 1985 DOA: 08/06/2021 DOS: the patient was seen and examined on 08/06/2021 PCP: Wendi Maya, NP  Patient coming from: Home  Chief Complaint:  Chief Complaint  Patient presents with   Abdominal Pain   HPI: [name] is a 36 y.o. male with medical history significant of bipolar disorder, hyperlipidemia and diverticulitis; who presented to the hospital secondary to abdominal pain (left lower quadrant, nonradiating, 6 out of 10 in intensity, dull, achy and constant; no aggravating or relieving factors reported.  Symptoms associated with nausea and decreased appetite).  Patient reports no focal weakness, no chest pain, no shortness of breath, no fever, no chills, no productive cough, no sick contacts. In the ED work-up included a CT abdomen and pelvis which demonstrated acute diverticulitis of the distal descending colon with contained microperforation.  Small amount of free fluid without rim-enhancing fluid collection, wall thickening within the mid sigmoid colon at the site of previous inflammation without associated inflammatory changes, which thought to be secondary to chronic diverticulitis.  WBCs 16.2; patient found to be tachycardic with heart rate up to 120s (sinus).  The rest of his examination and blood work within normal limits.  Patient is not vaccinated against COVID.  IV fluids per sepsis protocol initiated in the ED along with IV antibiotics; TRH and general surgery were consulted to place patient in the hospital and provide further care.  Review of Systems: As mentioned in the history of present illness. All other systems reviewed and are negative. Past Medical History:  Diagnosis Date   Anxiety    Bipolar 1 disorder ([place])    Brain injury [facility])    Past Surgical History:  Procedure Laterality Date   ANKLE SURGERY     COLONOSCOPY WITH PROPOFOL N/A 06/04/2021   Procedure: COLONOSCOPY  WITH PROPOFOL;  Surgeon: Rusty Aus, DO;  [facility];  Service: General;  Laterality: N/A;   FRACTURE SURGERY     Social History:  reports that he has been smoking cigarettes. He has a 4.00 pack-year smoking history. He does not have any smokeless tobacco history on file. He reports current drug use. Drug: Marijuana. He reports that he does not drink alcohol.  No Known Allergies  No family history on file.  Prior to Admission medications   Medication Sig Start Date End Date Taking? Authorizing Provider  acetaminophen (TYLENOL) 500 MG tablet Take 1,000 mg by mouth every 6 (six) hours as needed for mild pain or fever.   Yes [provider]  atorvastatin (LIPITOR) 10 MG tablet Take 10 mg by mouth at bedtime. 06/01/21  Yes [provider]  clonazePAM (KLONOPIN) 0.5 MG tablet Take 0.5 mg by mouth at bedtime. 03/30/21  Yes [provider]  divalproex (DEPAKOTE) 500 MG DR tablet Take 500 mg by mouth at bedtime. 03/27/21  Yes [provider]  haloperidol (HALDOL) 10 MG tablet Take 10 mg by mouth See admin instructions. Take 1 and 1/2 tablets every evening 03/15/21  Yes [provider]  hydrOXYzine (ATARAX) 25 MG tablet Take 25 mg by mouth at bedtime as needed. 07/29/21  Yes [provider]    Physical Exam: Vitals:   08/06/21 1331 08/06/21 1448 08/06/21 1600 08/06/21 1956  BP: 95/64 94/60 105/74 118/87  Pulse: (!) 113 (!) 110 (!) 108 (!) 127  Resp: 18 18 18 18   Temp: 99.4 F (37.4 C) 98.3 F (36.8 C) 98.9 F (37.2 C)  98.9 F (37.2 C)  TempSrc: Oral Oral Oral Oral  SpO2: 98% 100% 97% 96%   General exam: Alert, awake, oriented x 3; reporting mild nausea and pain in his left lower quadrant.  Tmax 99.4 Respiratory system: Clear to auscultation. Respiratory effort normal.  Good saturation on room air. Cardiovascular system: Sinus tachycardia, no rubs, no gallops, no JVD. Gastrointestinal system: Abdomen is soft,  nondistended and tender to palpation in the left lower quadrant; there was no guarding or rebound tenderness, positive bowel sounds.   Central nervous system: Alert and oriented. No focal neurological deficits. Extremities: No cyanosis, clubbing or edema. Skin: No petechiae. Psychiatry: Judgement and insight appear normal. Mood & affect appropriate.   Data Reviewed: CT scan demonstrating descending diverticulitis of the colon with contained microperforation as described in HPI. CBC: WBC 16.2, Hemoglobin 15.5 and platelets 236 K Comprehensive metabolic panel sodium 579, potassium 3.6, BUN 6, creatinine 0.86; normal LFTs. Lipase 23 Urinalysis not suggesting acute infection, negative nitrite, negative leukocyte Estrace and specific gravity 1.0 24  Assessment and Plan: * Diverticulitis of large intestine with perforation - Patient has met sepsis criteria at time of admission with tachycardia, elevated WBCs, Tmax 99.5; with presence of source of infection identified. -Fluid resuscitation initiated as per protocol -IV antibiotics (using Zosyn) started. -Cultures taken -Patient made n.p.o. except for ice chips and sips with medications (after discussing with general surgery). -As needed analgesics and antiemetics will be provided. -Follow conservative management and clinical response; general surgery on board following the recommendation specially if surgical intervention needed.  Tobacco abuse - Cessation counseling provided -Nicotine patch ordered  Hyperlipidemia -continue lipitor  Sepsis ([place]) - Patient with identified source meeting criteria for sepsis with elevated WBCs and tachycardia -Continue to maintain adequate hydration -Continue current IV antibiotic -Lactic acid within normal limits. -Follow clinical response.  Bipolar disorder ([place]) - With component of psychotic features intermittently. -Continue home psychiatric medications -Continue supportive care. -No suicidal  ideation or hallucinations currently appreciated. -Haldol, Depakote, Klonopin as needed Atarax      Advance Care Planning:   Code Status: Full Code   Consults: General surgery  Family Communication: No family at bedside  Severity of Illness: The appropriate patient status for this patient is INPATIENT. Inpatient status is judged to be reasonable and necessary in order to provide the required intensity of service to ensure the patient's safety. The patient's presenting symptoms, physical exam findings, and initial radiographic and laboratory data in the context of their chronic comorbidities is felt to place them at high risk for further clinical deterioration. Furthermore, it is not anticipated that the patient will be medically stable for discharge from the hospital within 2 midnights of admission.   * I certify that at the point of admission it is my clinical judgment that the patient will require inpatient hospital care spanning beyond 2 midnights from the point of admission due to high intensity of service, high risk for further deterioration and high frequency of surveillance required.*  Author: Barton Dubois, MD 08/06/2021 8:12 PM  For on call review [email or web address].

## 2021-08-06 NOTE — Assessment & Plan Note (Signed)
-   Patient with identified source meeting criteria for sepsis with elevated WBCs and tachycardia -Continue to maintain adequate hydration -Continue current IV antibiotic -Lactic acid within normal limits. -Follow clinical response.

## 2021-08-06 NOTE — ED Notes (Signed)
Blood cultures ordered after ABX administration.

## 2021-08-06 NOTE — Assessment & Plan Note (Signed)
-  Cessation counseling provided -Nicotine patch ordered. 

## 2021-08-06 NOTE — ED Notes (Signed)
Patient transported to CT 

## 2021-08-06 NOTE — Assessment & Plan Note (Addendum)
-  Patient has met sepsis criteria at time of admission with tachycardia, elevated WBCs, Tmax 99.5; with presence of source of infection identified. -Fluid resuscitation initiated as per protocol -IV antibiotics (using Zosyn) started. -Cultures taken -Patient made n.p.o. except for ice chips and sips with medications (after discussing with general surgery). -As needed analgesics and antiemetics will be provided. -Follow conservative management and clinical response; general surgery on board following the recommendation specially if surgical intervention needed.

## 2021-08-06 NOTE — Assessment & Plan Note (Signed)
-   With component of psychotic features intermittently. -Continue home psychiatric medications -Continue supportive care. -No suicidal ideation or hallucinations currently appreciated. -Haldol, Depakote, Klonopin as needed Atarax

## 2021-08-06 NOTE — ED Triage Notes (Signed)
Pt presents to ED, sent by [facility] in [place] for acute LLQ abdominal pain and nausea x 2 days. HR elevated at [facility] at 140-150's EKG showed ST rate 136.

## 2021-08-07 DIAGNOSIS — A419 Sepsis, unspecified organism: Secondary | ICD-10-CM | POA: Diagnosis not present

## 2021-08-07 DIAGNOSIS — E7800 Pure hypercholesterolemia, unspecified: Secondary | ICD-10-CM | POA: Diagnosis not present

## 2021-08-07 DIAGNOSIS — K5720 Diverticulitis of large intestine with perforation and abscess without bleeding: Secondary | ICD-10-CM | POA: Diagnosis not present

## 2021-08-07 DIAGNOSIS — F310 Bipolar disorder, current episode hypomanic: Secondary | ICD-10-CM

## 2021-08-07 LAB — COMPREHENSIVE METABOLIC PANEL
ALT: 12 U/L (ref 0–44)
AST: 11 U/L — ABNORMAL LOW (ref 15–41)
Albumin: 3 g/dL — ABNORMAL LOW (ref 3.5–5.0)
Alkaline Phosphatase: 49 U/L (ref 38–126)
Anion gap: 6 (ref 5–15)
BUN: 6 mg/dL (ref 6–20)
CO2: 27 mmol/L (ref 22–32)
Calcium: 8.3 mg/dL — ABNORMAL LOW (ref 8.9–10.3)
Chloride: 104 mmol/L (ref 98–111)
Creatinine, Ser: 0.87 mg/dL (ref 0.61–1.24)
GFR, Estimated: >60 mL/min (ref >60)
Glucose, Bld: 112 mg/dL — ABNORMAL HIGH (ref 70–99)
Potassium: 4 mmol/L (ref 3.5–5.1)
Sodium: 137 mmol/L (ref 135–145)
Total Bilirubin: 0.1 mg/dL — ABNORMAL LOW (ref 0.3–1.2)
Total Protein: 6.2 g/dL — ABNORMAL LOW (ref 6.5–8.1)

## 2021-08-07 LAB — CBC
HCT: 40.7 % (ref 39.0–52.0)
Hemoglobin: 13.4 g/dL (ref 13.0–17.0)
MCH: 31.8 pg (ref 26.0–34.0)
MCHC: 32.9 g/dL (ref 30.0–36.0)
MCV: 96.4 fL (ref 80.0–100.0)
Platelets: 223 10*3/uL (ref 150–400)
RBC: 4.22 MIL/uL (ref 4.22–5.81)
RDW: 13.2 % (ref 11.5–15.5)
WBC: 17.3 10*3/uL — ABNORMAL HIGH (ref 4.0–10.5)
nRBC: 0 % (ref 0.0–0.2)

## 2021-08-07 LAB — MAGNESIUM: Magnesium: 1.9 mg/dL (ref 1.7–2.4)

## 2021-08-07 LAB — PHOSPHORUS: Phosphorus: 3.3 mg/dL (ref 2.5–4.6)

## 2021-08-07 NOTE — Progress Notes (Signed)
[facility] Progress Note     Subjective: Patient seen and examined.  He is resting comfortably in bed.  He continues to have abdominal pain, but it is controlled at this time, as he just received a dose of Dilaudid.  Prior to the Dilaudid, the pain was similar in pain that he had yesterday.  He denies any nausea or episodes of emesis.  He confirms passing flatus but denies any bowel movements since being in the hospital.  He denies fevers and chills.  Objective: Vital signs in last 24 hours: Temp:  [98.1 F (36.7 C)-99.5 F (37.5 C)] 98.7 F (37.1 C) (05/31 0518) Pulse Rate:  [96-130] 96 (05/31 0723) Resp:  [14-22] 16 (05/31 0518) BP: (94-133)/(54-87) 119/79 (05/31 0518) SpO2:  [93 %-100 %] 93 % (05/31 0518) Last BM Date : 08/05/21  Intake/Output from previous day: 05/30 0701 - 05/31 0700 In: 1608.1 [I.V.:1501.1; IV Piggyback:107] Out: 1100 [Urine:1100] Intake/Output this shift: No intake/output data recorded.  General appearance: alert, cooperative, and no distress GI: Abdomen soft, nondistended, no percussion tenderness, tenderness to palpation in the left lower quadrant; no rigidity, guarding, rebound tenderness  Lab Results:  Recent Labs    08/06/21 1057 08/07/21 0433  WBC 16.2* 17.3*  HGB 15.5 13.4  HCT 46.5 40.7  PLT 236 223   BMET Recent Labs    08/06/21 1057 08/06/21 1254 08/07/21 0433  NA 136  --  137  K 3.6  --  4.0  CL 103  --  104  CO2 27  --  27  GLUCOSE 101*  --  112*  BUN 6  --  6  CREATININE 0.86 0.90 0.87  CALCIUM 8.6*  --  8.3*   PT/INR Recent Labs    08/06/21 1254  LABPROT 14.3  INR 1.1    Studies/Results: CT ABDOMEN PELVIS W CONTRAST  Result Date: 08/06/2021 CLINICAL DATA:  Left lower quadrant abdominal pain EXAM: CT ABDOMEN AND PELVIS WITH CONTRAST TECHNIQUE: Multidetector CT imaging of the abdomen and pelvis was performed using the standard protocol following bolus administration of intravenous contrast.  RADIATION DOSE REDUCTION: This exam was performed according to the departmental dose-optimization program which includes automated exposure control, adjustment of the mA and/or kV according to patient size and/or use of iterative reconstruction technique. CONTRAST:  OMNIPAQUE IOHEXOL 300 MG/ML  SOLN COMPARISON:  04/01/2021 FINDINGS: Lower chest: No acute abnormality. Hepatobiliary: No focal liver abnormality is seen. No gallstones, gallbladder wall thickening, or biliary dilatation. Pancreas: Unremarkable. No pancreatic ductal dilatation or surrounding inflammatory changes. Spleen: Normal in size without focal abnormality. Adrenals/Urinary Tract: Unremarkable adrenal glands. Kidneys enhance symmetrically without focal lesion, stone, or hydronephrosis. Ureters are nondilated. Urinary bladder is decompressed, limiting its evaluation. Stomach/Bowel: Stomach within normal limits. No dilated loops of bowel. Focally thickened, inflamed segment of distal descending colon containing inflamed appearing diverticula. Multiple foci of extraluminal air adjacent to the segment compatible with a contained micro perforation (series 2, image 72). Extensive surrounding pericolonic fat stranding and a small amount of fluid within the left pericolic gutter. No well-defined or rim enhancing fluid collection is present at this time. Mild wall thickening within the mid sigmoid colon at site of previously seen inflammation without associated inflammatory changes, likely related to chronic diverticulitis. No additional sites of focal bowel wall thickening. Vascular/Lymphatic: No significant vascular findings are present. No enlarged abdominal or pelvic lymph nodes. Reproductive: Prostate is unremarkable. Other: No significant volume of pneumoperitoneum within the abdomen. No abdominal wall hernia. Musculoskeletal: No  acute or significant osseous findings. IMPRESSION: 1. Acute diverticulitis of the distal descending colon with evidence  of a contained micro perforation. Adjacent ill-defined free fluid, however no well-defined or rim-enhancing fluid collection is present at this time. 2. Mild wall thickening within the mid sigmoid colon at site of previously seen inflammation without associated inflammatory changes, likely related to chronic diverticulitis. These results were called by telephone at the time of interpretation on 08/06/2021 at 12:03 pm to provider [facility] , who verbally acknowledged these results. Electronically Signed   By: [name] D.O.   On: 08/06/2021 12:08   DG Chest Port 1 View  Result Date: 08/06/2021 CLINICAL DATA:  Question sepsis EXAM: PORTABLE CHEST 1 VIEW COMPARISON:  None Available. FINDINGS: Heart size and vascularity normal. Lungs clear without infiltrate or effusion. No acute skeletal abnormality. IMPRESSION: No active disease. Electronically Signed   By: [name] M.D.   On: 08/06/2021 12:50    Anti-infectives: Anti-infectives (From admission, onward)    Start     Dose/Rate Route Frequency Ordered Stop   08/06/21 2000  piperacillin-tazobactam (ZOSYN) IVPB 3.375 g        3.375 g 12.5 mL/hr over 240 Minutes Intravenous Every 8 hours 08/06/21 1242     08/06/21 1215  piperacillin-tazobactam (ZOSYN) IVPB 3.375 g        3.375 g 100 mL/hr over 30 Minutes Intravenous  Once 08/06/21 1205 08/06/21 1303       Assessment/Plan:  Patient is a 36 year old male who was admitted with acute diverticulitis of the distal descending colon with contained microperforation.  -Leukocytosis slightly increased this morning to 17.3 from 16.2 -Continue IV Zosyn -Given increasing leukocytosis with sustained pain, will maintain NPO at this time -Will plan to continue with conservative management at this time, as his abdominal pain is not worsening and his vitals are otherwise stable -Tachycardia slightly improved this morning -PRN pain control and antiemetics -Monitor for bowel function -AM  labs -Appreciate hospitalist recommendations   LOS: 1 day    [name] A [name] 08/07/2021

## 2021-08-07 NOTE — TOC Progression Note (Signed)
  Transition of Care [facility]) Screening Note   Patient Details  Name: [name] Date of Birth: September 13, 1985   Transition of Care [facility]) CM/SW Contact:    Boneta Lucks, RN Phone Number: 08/07/2021, 12:07 PM    Transition of Care Department [facility]) has reviewed patient and no TOC needs have been identified at this time. We will continue to monitor patient advancement through interdisciplinary progression rounds. If new patient transition needs arise, please place a TOC consult.     Barriers to Discharge: Continued Medical Work up

## 2021-08-07 NOTE — Progress Notes (Signed)
[facility]                                                                              [name], is a 36 y.o. male, DOB - Oct 20, 1985, [id number] Admit date - 08/06/2021    Outpatient Primary MD for the patient is Wendi Maya, NP  LOS - 1  days  Chief Complaint  Patient presents with   Abdominal Pain       Brief summary   Patient is a 36 year old male with history of anxiety, bipolar disorder presented with 3-day history of lower abdominal pain.  Pain started after he strained to have a BM.  This pain is similar to the pain he experienced the last time he was in the hospital with colitis.  No fevers however felt chills.  Did have some associated nausea but no emesis.  Last BM a day before the admission.  CT abdomen pelvis showed acute diverticulitis of the descending distal colon with contained microperforation, small amount of free fluid without rim-enhancing fluid collection mild wall thickening within the mid sigmoid colon at the site of previous inflammation without associated inflammatory changes thought to be chronic diverticulitis.  Assessment & Plan    Principal Problem: Sepsis, diverticulitis of large intestine with micro perforation -Patient met sepsis criteria at the time of admission with tachycardia, leukocytosis, and febrile and diverticulitis -Continue IV fluid hydration, n.p.o., IV antibiotics. -Follow cultures. -Continue pain control -General surgery consulted, recommended conservative management for now with IV antibiotics  Active Problems:   Bipolar disorder ([place]) -Continue home psych medications, including Haldol, Depakote, Klonopin - supportive care    Sepsis ([place]) -As #1    Hyperlipidemia Continue Lipitor    Tobacco abuse -Counseled on smoking cessation, continue nicotine patch   Code Status: Full code DVT Prophylaxis:  heparin injection 5,000 Units Start: 08/06/21 1400   Level of Care: Level of care:  Telemetry Family Communication: Updated patient   Disposition Plan:      Remains inpatient appropriate: Still complaining of abdominal pain, still has leukocytosis, continue current medical management, IV antibiotics.   Procedures:  None  Consultants:   General surgery  Antimicrobials:   IV Zosyn 08/06/2021--->     Medications  atorvastatin  10 mg Oral QHS   clonazePAM  0.5 mg Oral QHS   divalproex  500 mg Oral QHS   haloperidol  10 mg Oral QHS   heparin  5,000 Units Subcutaneous Q8H   nicotine  21 mg Transdermal Daily      Subjective:   [name] was seen and examined today.  Complaining of abdominal pain left lower quadrant 5/10.  No fevers or chills.  No acute nausea or vomiting.  Patient denies dizziness, chest pain, shortness of breath. No acute events overnight.    Objective:   Vitals:   08/06/21 1956 08/06/21 2355 08/07/21 0518 08/07/21 0723  BP: 118/87 116/85 119/79   Pulse: (!) 127 (!) 121 (!) 111 96  Resp: 18 18 16    Temp: 98.9 F (37.2 C) 98.7 F (37.1 C) 98.7 F (37.1 C)   TempSrc: Oral Oral    SpO2: 96% 97%  93%     Intake/Output Summary (Last 24 hours) at 08/07/2021 0918 Last data filed at 08/07/2021 0500 Gross per 24 hour  Intake 1608.13 ml  Output 1100 ml  Net 508.13 ml     Wt Readings from Last 3 Encounters:  05/14/21 114.3 kg  04/18/21 114.8 kg  04/01/21 118.1 kg     Exam General: Alert and oriented x 3, NAD Cardiovascular: S1 S2 auscultated,  RRR Respiratory: Clear to auscultation bilaterally, no wheezing, rales or rhonchi Gastrointestinal: Soft, TTP LLQ , nondistended, + bowel sounds Ext: no pedal edema bilaterally Neuro: Strength 5/5 upper and lower extremities bilaterally Musculoskeletal: No digital cyanosis, clubbing Skin: No rashes Psych: Normal affect and demeanor, alert and oriented x3     Data Reviewed:  I have personally reviewed following labs    CBC Lab Results  Component Value Date   WBC 17.3 (H)  08/07/2021   RBC 4.22 08/07/2021   HGB 13.4 08/07/2021   HCT 40.7 08/07/2021   MCV 96.4 08/07/2021   MCH 31.8 08/07/2021   PLT 223 08/07/2021   MCHC 32.9 08/07/2021   RDW 13.2 08/07/2021   LYMPHSABS 1.8 04/01/2021   MONOABS 2.6 (H) 04/01/2021   EOSABS 0.0 04/01/2021   BASOSABS 0.1 67/61/9509     Last metabolic panel Lab Results  Component Value Date   NA 137 08/07/2021   K 4.0 08/07/2021   CL 104 08/07/2021   CO2 27 08/07/2021   BUN 6 08/07/2021   CREATININE 0.87 08/07/2021   GLUCOSE 112 (H) 08/07/2021   GFRNONAA >60 08/07/2021   GFRAA >90 03/16/2012   CALCIUM 8.3 (L) 08/07/2021   PHOS 3.3 08/07/2021   PROT 6.2 (L) 08/07/2021   ALBUMIN 3.0 (L) 08/07/2021   BILITOT 0.1 (L) 08/07/2021   ALKPHOS 49 08/07/2021   AST 11 (L) 08/07/2021   ALT 12 08/07/2021   ANIONGAP 6 08/07/2021    CBG (last 3)  No results for input(s): GLUCAP in the last 72 hours.    Coagulation Profile: Recent Labs  Lab 08/06/21 1254  INR 1.1     Radiology Studies: I have personally reviewed the imaging studies  CT ABDOMEN PELVIS W CONTRAST  Result Date: 08/06/2021 IMPRESSION: 1. Acute diverticulitis of the distal descending colon with evidence of a contained micro perforation. Adjacent ill-defined free fluid, however no well-defined or rim-enhancing fluid collection is present at this time. 2. Mild wall thickening within the mid sigmoid colon at site of previously seen inflammation without associated inflammatory changes, likely related to chronic diverticulitis. These results were called by telephone at the time of interpretation on 08/06/2021 at 12:03 pm to provider [facility] , who verbally acknowledged these results. Electronically Signed   By: Davina Poke D.O.   On: 08/06/2021 12:08   DG Chest Port 1 View  Result Date: 08/06/2021 CLINICAL DATA:  Question sepsis EXAM: PORTABLE CHEST 1 VIEW COMPARISON:  None Available. FINDINGS: Heart size and vascularity normal. Lungs clear without  infiltrate or effusion. No acute skeletal abnormality. IMPRESSION: No active disease. Electronically Signed   By: Franchot Gallo M.D.   On: 08/06/2021 12:50       Lakin Rhine M.D. [facility] 08/07/2021, 9:18 AM  Available via Epic secure chat 7am-7pm After 7 pm, please refer to night coverage provider listed on amion.

## 2021-08-08 DIAGNOSIS — K5720 Diverticulitis of large intestine with perforation and abscess without bleeding: Secondary | ICD-10-CM | POA: Diagnosis not present

## 2021-08-08 DIAGNOSIS — E7800 Pure hypercholesterolemia, unspecified: Secondary | ICD-10-CM | POA: Diagnosis not present

## 2021-08-08 DIAGNOSIS — A419 Sepsis, unspecified organism: Secondary | ICD-10-CM | POA: Diagnosis not present

## 2021-08-08 LAB — BASIC METABOLIC PANEL
Anion gap: 6 (ref 5–15)
BUN: 6 mg/dL (ref 6–20)
CO2: 27 mmol/L (ref 22–32)
Calcium: 8.5 mg/dL — ABNORMAL LOW (ref 8.9–10.3)
Chloride: 104 mmol/L (ref 98–111)
Creatinine, Ser: 0.74 mg/dL (ref 0.61–1.24)
GFR, Estimated: >60 mL/min (ref >60)
Glucose, Bld: 100 mg/dL — ABNORMAL HIGH (ref 70–99)
Potassium: 3.6 mmol/L (ref 3.5–5.1)
Sodium: 137 mmol/L (ref 135–145)

## 2021-08-08 LAB — CBC
HCT: 39.6 % (ref 39.0–52.0)
Hemoglobin: 13 g/dL (ref 13.0–17.0)
MCH: 31.4 pg (ref 26.0–34.0)
MCHC: 32.8 g/dL (ref 30.0–36.0)
MCV: 95.7 fL (ref 80.0–100.0)
Platelets: 232 10*3/uL (ref 150–400)
RBC: 4.14 MIL/uL — ABNORMAL LOW (ref 4.22–5.81)
RDW: 13 % (ref 11.5–15.5)
WBC: 11.4 10*3/uL — ABNORMAL HIGH (ref 4.0–10.5)
nRBC: 0 % (ref 0.0–0.2)

## 2021-08-08 MED ORDER — MORPHINE SULFATE (PF) 2 MG/ML IV SOLN
1.0000 mg | INTRAVENOUS | Status: DC | PRN
  Administered 2021-08-08 – 2021-08-09 (×4): 1 mg via INTRAVENOUS
  Filled 2021-08-08 (×4): qty 1

## 2021-08-08 NOTE — Progress Notes (Signed)
Patient is pleasant and content, no complaints or needs at this time. Dilaudid was changed to morphine at patients request as dilaudid was causing headaches.

## 2021-08-08 NOTE — Progress Notes (Signed)
         [facility]                                                                              [name], is a 36 y.o. male, DOB - 03/15/1985, MRN:[id number] Admit date - 08/06/2021    Outpatient Primary MD for the patient is Richardson, Jennifer P, NP  LOS - 2  days  Chief Complaint  Patient presents with   Abdominal Pain       Brief summary   Patient is a 36-year-old male with history of anxiety, bipolar disorder presented with 3-day history of lower abdominal pain.  Pain started after he strained to have a BM.  This pain is similar to the pain he experienced the last time he was in the hospital with colitis.  No fevers however felt chills.  Did have some associated nausea but no emesis.  Last BM a day before the admission.  CT abdomen pelvis showed acute diverticulitis of the descending distal colon with contained microperforation, small amount of free fluid without rim-enhancing fluid collection mild wall thickening within the mid sigmoid colon at the site of previous inflammation without associated inflammatory changes thought to be chronic diverticulitis.  Assessment & Plan    Principal Problem: Sepsis, diverticulitis of large intestine with micro perforation -Patient met sepsis criteria at the time of admission with tachycardia, leukocytosis, and febrile and diverticulitis -Continue IV Zosyn, diet advanced to clear liquids today -Overall improving, pain, nausea improving.  WBC decreased to 11.4 -General surgery following  Active Problems:   Bipolar disorder ([facility]) -Continue home psych medications, including Haldol, Depakote, Klonopin - supportive care    Sepsis ([facility]) -As #1    Hyperlipidemia Continue Lipitor    Tobacco abuse -Continue nicotine patch   Code Status: Full code DVT Prophylaxis:  heparin injection 5,000 Units Start: 08/06/21 1400   Level of Care: Level of care: Med-Surg Family Communication: Updated patient   Disposition Plan:       Remains inpatient appropriate: Hopefully DC home in 24 to 48 hours if continues to improve.    Procedures:  None  Consultants:   General surgery  Antimicrobials:   IV Zosyn 08/06/2021--->     Medications  atorvastatin  10 mg Oral QHS   clonazePAM  0.5 mg Oral QHS   divalproex  500 mg Oral QHS   haloperidol  10 mg Oral QHS   heparin  5,000 Units Subcutaneous Q8H   nicotine  21 mg Transdermal Daily      Subjective:   [name] was seen and examined today.  Overall improving today, pain less, no acute nausea vomiting or diarrhea.  No fevers overnight.    Objective:   Vitals:   08/07/21 0945 08/07/21 2054 08/08/21 0540 08/08/21 1236  BP: 104/68 110/78 (!) 90/59 126/77  Pulse: 100 97 96 99  Resp: 18 20 18 18  Temp: 98.1 F (36.7 C) 99.1 F (37.3 C) 99 F (37.2 C) 98.3 F (36.8 C)  TempSrc: Oral Oral    SpO2: 96% 99% 96% 97%    Intake/Output Summary (Last 24 hours) at 08/08/2021 1331 Last data filed at 08/08/2021 1310 Gross per 24   hour  Intake 3291.45 ml  Output 1200 ml  Net 2091.45 ml     Wt Readings from Last 3 Encounters:  05/14/21 114.3 kg  04/18/21 114.8 kg  04/01/21 118.1 kg   Physical Exam General: Alert and oriented x 3, NAD Cardiovascular: S1 S2 clear, RRR.  Respiratory: CTAB, no wheezing, rales or rhonchi Gastrointestinal: Soft, mild TTP LLQ , nondistended, NBS Ext: no pedal edema bilaterally Neuro: no new deficits psych: Normal affect and demeanor, alert and oriented x3     Data Reviewed:  I have personally reviewed following labs    CBC Lab Results  Component Value Date   WBC 11.4 (H) 08/08/2021   RBC 4.14 (L) 08/08/2021   HGB 13.0 08/08/2021   HCT 39.6 08/08/2021   MCV 95.7 08/08/2021   MCH 31.4 08/08/2021   PLT 232 08/08/2021   MCHC 32.8 08/08/2021   RDW 13.0 08/08/2021   LYMPHSABS 1.8 04/01/2021   MONOABS 2.6 (H) 04/01/2021   EOSABS 0.0 04/01/2021   BASOSABS 0.1 04/01/2021     Last metabolic panel Lab Results   Component Value Date   NA 137 08/08/2021   K 3.6 08/08/2021   CL 104 08/08/2021   CO2 27 08/08/2021   BUN 6 08/08/2021   CREATININE 0.74 08/08/2021   GLUCOSE 100 (H) 08/08/2021   GFRNONAA >60 08/08/2021   GFRAA >90 03/16/2012   CALCIUM 8.5 (L) 08/08/2021   PHOS 3.3 08/07/2021   PROT 6.2 (L) 08/07/2021   ALBUMIN 3.0 (L) 08/07/2021   BILITOT 0.1 (L) 08/07/2021   ALKPHOS 49 08/07/2021   AST 11 (L) 08/07/2021   ALT 12 08/07/2021   ANIONGAP 6 08/08/2021    CBG (last 3)  No results for input(s): GLUCAP in the last 72 hours.    Coagulation Profile: Recent Labs  Lab 08/06/21 1254  INR 1.1     Radiology Studies: I have personally reviewed the imaging studies  CT ABDOMEN PELVIS W CONTRAST  Result Date: 08/06/2021 IMPRESSION: 1. Acute diverticulitis of the distal descending colon with evidence of a contained micro perforation. Adjacent ill-defined free fluid, however no well-defined or rim-enhancing fluid collection is present at this time. 2. Mild wall thickening within the mid sigmoid colon at site of previously seen inflammation without associated inflammatory changes, likely related to chronic diverticulitis. These results were called by telephone at the time of interpretation on 08/06/2021 at 12:03 pm to provider BRIAN MILLER , who verbally acknowledged these results. Electronically Signed   By: Nicholas  Plundo D.O.   On: 08/06/2021 12:08   DG Chest Port 1 View  Result Date: 08/06/2021 CLINICAL DATA:  Question sepsis EXAM: PORTABLE CHEST 1 VIEW COMPARISON:  None Available. FINDINGS: Heart size and vascularity normal. Lungs clear without infiltrate or effusion. No acute skeletal abnormality. IMPRESSION: No active disease. Electronically Signed   By: Charles  Clark M.D.   On: 08/06/2021 12:50         M.D. [facility] 08/08/2021, 1:31 PM  Available via Epic secure chat 7am-7pm After 7 pm, please refer to night coverage provider listed on amion.    

## 2021-08-08 NOTE — Progress Notes (Signed)
[facility] Progress Note     Subjective: Patient seen and examined.  He is resting comfortably in bed.  He said he feels "fantastic."  He has had significant improvement in his abdominal pain.  He is passing flatus, but denies any bowel movements.  He denies nausea and vomiting.  He denies fevers and chills.  Objective: Vital signs in last 24 hours: Temp:  [98.1 F (36.7 C)-99.1 F (37.3 C)] 99 F (37.2 C) (06/01 0540) Pulse Rate:  [96-100] 96 (06/01 0540) Resp:  [18-20] 18 (06/01 0540) BP: (90-110)/(59-78) 90/59 (06/01 0540) SpO2:  [96 %-99 %] 96 % (06/01 0540) Last BM Date : 08/05/21  Intake/Output from previous day: 05/31 0701 - 06/01 0700 In: 2234.7 [P.O.:120; I.V.:2028.8; IV Piggyback:85.9] Out: 1400 [Urine:1400] Intake/Output this shift: Total I/O In: 0  Out: 200 [Urine:200]  General appearance: alert, cooperative, and no distress GI: Abdomen soft, nondistended, no percussion tenderness, mild tenderness to palpation in left lower quadrant; no rigidity, guarding, or rebound tenderness  Lab Results:  Recent Labs    08/07/21 0433 08/08/21 0425  WBC 17.3* 11.4*  HGB 13.4 13.0  HCT 40.7 39.6  PLT 223 232   BMET Recent Labs    08/07/21 0433 08/08/21 0425  NA 137 137  K 4.0 3.6  CL 104 104  CO2 27 27  GLUCOSE 112* 100*  BUN 6 6  CREATININE 0.87 0.74  CALCIUM 8.3* 8.5*   PT/INR Recent Labs    08/06/21 1254  LABPROT 14.3  INR 1.1    Studies/Results: CT ABDOMEN PELVIS W CONTRAST  Result Date: 08/06/2021 CLINICAL DATA:  Left lower quadrant abdominal pain EXAM: CT ABDOMEN AND PELVIS WITH CONTRAST TECHNIQUE: Multidetector CT imaging of the abdomen and pelvis was performed using the standard protocol following bolus administration of intravenous contrast. RADIATION DOSE REDUCTION: This exam was performed according to the departmental dose-optimization program which includes automated exposure control, adjustment of the mA and/or kV  according to patient size and/or use of iterative reconstruction technique. CONTRAST:  OMNIPAQUE IOHEXOL 300 MG/ML  SOLN COMPARISON:  04/01/2021 FINDINGS: Lower chest: No acute abnormality. Hepatobiliary: No focal liver abnormality is seen. No gallstones, gallbladder wall thickening, or biliary dilatation. Pancreas: Unremarkable. No pancreatic ductal dilatation or surrounding inflammatory changes. Spleen: Normal in size without focal abnormality. Adrenals/Urinary Tract: Unremarkable adrenal glands. Kidneys enhance symmetrically without focal lesion, stone, or hydronephrosis. Ureters are nondilated. Urinary bladder is decompressed, limiting its evaluation. Stomach/Bowel: Stomach within normal limits. No dilated loops of bowel. Focally thickened, inflamed segment of distal descending colon containing inflamed appearing diverticula. Multiple foci of extraluminal air adjacent to the segment compatible with a contained micro perforation (series 2, image 72). Extensive surrounding pericolonic fat stranding and a small amount of fluid within the left pericolic gutter. No well-defined or rim enhancing fluid collection is present at this time. Mild wall thickening within the mid sigmoid colon at site of previously seen inflammation without associated inflammatory changes, likely related to chronic diverticulitis. No additional sites of focal bowel wall thickening. Vascular/Lymphatic: No significant vascular findings are present. No enlarged abdominal or pelvic lymph nodes. Reproductive: Prostate is unremarkable. Other: No significant volume of pneumoperitoneum within the abdomen. No abdominal wall hernia. Musculoskeletal: No acute or significant osseous findings. IMPRESSION: 1. Acute diverticulitis of the distal descending colon with evidence of a contained micro perforation. Adjacent ill-defined free fluid, however no well-defined or rim-enhancing fluid collection is present at this time. 2. Mild wall thickening  within the mid sigmoid colon  at site of previously seen inflammation without associated inflammatory changes, likely related to chronic diverticulitis. These results were called by telephone at the time of interpretation on 08/06/2021 at 12:03 pm to provider [facility] , who verbally acknowledged these results. Electronically Signed   By: Duanne Guess D.O.   On: 08/06/2021 12:08   DG Chest Port 1 View  Result Date: 08/06/2021 CLINICAL DATA:  Question sepsis EXAM: PORTABLE CHEST 1 VIEW COMPARISON:  None Available. FINDINGS: Heart size and vascularity normal. Lungs clear without infiltrate or effusion. No acute skeletal abnormality. IMPRESSION: No active disease. Electronically Signed   By: Marlan Palau M.D.   On: 08/06/2021 12:50    Anti-infectives: Anti-infectives (From admission, onward)    Start     Dose/Rate Route Frequency Ordered Stop   08/06/21 2000  piperacillin-tazobactam (ZOSYN) IVPB 3.375 g        3.375 g 12.5 mL/hr over 240 Minutes Intravenous Every 8 hours 08/06/21 1242     08/06/21 1215  piperacillin-tazobactam (ZOSYN) IVPB 3.375 g        3.375 g 100 mL/hr over 30 Minutes Intravenous  Once 08/06/21 1205 08/06/21 1303       Assessment/Plan:  Patient is a 36 year old male who was admitted with acute diverticulitis of the descending colon with a contained microperforation.  -Leukocytosis has significantly improved today, 11.4 from 17.3 -Will repeat labs tomorrow morning -Continue IV Zosyn -Clear liquid diet ordered -Patient remains hemodynamically stable without tachycardia -PRN pain control and antiemetics -Monitor for bowel function -Appreciate hospitalist recommendations   LOS: 2 days    Mariadelcarmen Corella A Gari Hartsell 08/08/2021

## 2021-08-09 DIAGNOSIS — K5720 Diverticulitis of large intestine with perforation and abscess without bleeding: Secondary | ICD-10-CM | POA: Diagnosis not present

## 2021-08-09 DIAGNOSIS — Z720 Tobacco use: Secondary | ICD-10-CM | POA: Diagnosis not present

## 2021-08-09 DIAGNOSIS — A419 Sepsis, unspecified organism: Secondary | ICD-10-CM | POA: Diagnosis not present

## 2021-08-09 LAB — CBC
HCT: 41.5 % (ref 39.0–52.0)
Hemoglobin: 13.6 g/dL (ref 13.0–17.0)
MCH: 31.2 pg (ref 26.0–34.0)
MCHC: 32.8 g/dL (ref 30.0–36.0)
MCV: 95.2 fL (ref 80.0–100.0)
Platelets: 273 10*3/uL (ref 150–400)
RBC: 4.36 MIL/uL (ref 4.22–5.81)
RDW: 12.8 % (ref 11.5–15.5)
WBC: 7.3 10*3/uL (ref 4.0–10.5)
nRBC: 0 % (ref 0.0–0.2)

## 2021-08-09 LAB — BASIC METABOLIC PANEL
Anion gap: 9 (ref 5–15)
BUN: <5 mg/dL — ABNORMAL LOW (ref 6–20)
CO2: 27 mmol/L (ref 22–32)
Calcium: 8.6 mg/dL — ABNORMAL LOW (ref 8.9–10.3)
Chloride: 105 mmol/L (ref 98–111)
Creatinine, Ser: 0.84 mg/dL (ref 0.61–1.24)
GFR, Estimated: >60 mL/min (ref >60)
Glucose, Bld: 95 mg/dL (ref 70–99)
Potassium: 3.6 mmol/L (ref 3.5–5.1)
Sodium: 141 mmol/L (ref 135–145)

## 2021-08-09 MED ORDER — ONDANSETRON HCL 4 MG PO TABS: 4.0000 mg | ORAL_TABLET | Freq: Three times a day (TID) | ORAL | 0 refills | Status: AC | PRN

## 2021-08-09 MED ORDER — TRAMADOL HCL 50 MG PO TABS: 50.0000 mg | ORAL_TABLET | Freq: Four times a day (QID) | ORAL | 0 refills | Status: DC | PRN

## 2021-08-09 MED ORDER — AMOXICILLIN-POT CLAVULANATE 875-125 MG PO TABS: 1.0000 | ORAL_TABLET | Freq: Two times a day (BID) | ORAL | 0 refills | Status: AC

## 2021-08-09 MED ORDER — SENNOSIDES-DOCUSATE SODIUM 8.6-50 MG PO TABS
2.0000 | ORAL_TABLET | Freq: Two times a day (BID) | ORAL | Status: DC
  Administered 2021-08-09: 2 via ORAL
  Filled 2021-08-09: qty 2

## 2021-08-09 NOTE — Progress Notes (Signed)
[facility] Progress Note     Subjective: Patient seen and examined.  He is resting comfortably in bed.  He denies any significant abdominal pain at this time.  He was able to tolerate his full liquid diet this morning without nausea and vomiting.  He continues to pass flatus, but denies any bowel movements.  He denies any fevers or chills.  Objective: Vital signs in last 24 hours: Temp:  [98.3 F (36.8 C)-99 F (37.2 C)] 98.7 F (37.1 C) (06/02 0601) Pulse Rate:  [87-99] 99 (06/02 1207) Resp:  [17-18] 18 (06/02 1207) BP: (94-126)/(63-82) 105/79 (06/02 1207) SpO2:  [95 %-97 %] 95 % (06/02 1207) Last BM Date : 08/05/21  Intake/Output from previous day: 06/01 0701 - 06/02 0700 In: 3092.7 [P.O.:2036; I.V.:1016.5; IV Piggyback:40.2] Out: 1000 [Urine:1000] Intake/Output this shift: Total I/O In: 813.1 [I.V.:813.1] Out: -   General appearance: alert, cooperative, and no distress GI: Abdomen soft, nondistended, no percussion tenderness, minimal tenderness to palpation in the left lower quadrant; no rigidity, guarding, rebound tenderness  Lab Results:  Recent Labs    08/08/21 0425 08/09/21 0451  WBC 11.4* 7.3  HGB 13.0 13.6  HCT 39.6 41.5  PLT 232 273   BMET Recent Labs    08/08/21 0425 08/09/21 0451  NA 137 141  K 3.6 3.6  CL 104 105  CO2 27 27  GLUCOSE 100* 95  BUN 6 <5*  CREATININE 0.74 0.84  CALCIUM 8.5* 8.6*   PT/INR Recent Labs    08/06/21 1254  LABPROT 14.3  INR 1.1    Studies/Results: No results found.  Anti-infectives: Anti-infectives (From admission, onward)    Start     Dose/Rate Route Frequency Ordered Stop   08/06/21 2000  piperacillin-tazobactam (ZOSYN) IVPB 3.375 g        3.375 g 12.5 mL/hr over 240 Minutes Intravenous Every 8 hours 08/06/21 1242     08/06/21 1215  piperacillin-tazobactam (ZOSYN) IVPB 3.375 g        3.375 g 100 mL/hr over 30 Minutes Intravenous  Once 08/06/21 1205 08/06/21 1303        Assessment/Plan:  Patient is a 36 year old male who was admitted with acute diverticulitis of distal descending colon with a contained microperforation.  -Leukocytosis resolved this morning, 7.3 -Diet advanced to soft diet -PRN pain control and antiemetics -Senokot-S ordered -If patient tolerates his soft diet, he is stable for discharge from general surgery standpoint -I recommend discharge home with 10 days of Augmentin -he will have received a total of 14 days of treatment -Patient will follow-up with me in 1 month   LOS: 3 days    Marsalis Beaulieu A Virga Haltiwanger 08/09/2021

## 2021-08-09 NOTE — Progress Notes (Signed)
IV removed and discharge instructions reviewed.  Scripts sent to pharmacy to schedule follow up with primary and surgery.  Grandfather to pick up.  Escorted to front door to wait for ride.

## 2021-08-09 NOTE — Progress Notes (Signed)
N[facility] Discharge Note  Admit Date:  08/06/2021 Discharge date: 08/09/2021   [name] to be D/C'd Home per MD order.  AVS completed. Patient/caregiver able to verbalize understanding.  Discharge Medication: Allergies as of 08/09/2021   No Known Allergies      Medication List     TAKE these medications    acetaminophen 500 MG tablet Commonly known as: TYLENOL Take 1,000 mg by mouth every 6 (six) hours as needed for mild pain or fever.   amoxicillin-clavulanate 875-125 MG tablet Commonly known as: AUGMENTIN Take 1 tablet by mouth 2 (two) times daily for 10 days.   atorvastatin 10 MG tablet Commonly known as: LIPITOR Take 10 mg by mouth at bedtime.   clonazePAM 0.5 MG tablet Commonly known as: KLONOPIN Take 0.5 mg by mouth at bedtime.   divalproex 500 MG DR tablet Commonly known as: DEPAKOTE Take 500 mg by mouth at bedtime.   haloperidol 10 MG tablet Commonly known as: HALDOL Take 10 mg by mouth See admin instructions. Take 1 and 1/2 tablets every evening   hydrOXYzine 25 MG tablet Commonly known as: ATARAX Take 25 mg by mouth at bedtime as needed.   ondansetron 4 MG tablet Commonly known as: Zofran Take 1 tablet (4 mg total) by mouth every 8 (eight) hours as needed for nausea or vomiting.   traMADol 50 MG tablet Commonly known as: Ultram Take 1 tablet (50 mg total) by mouth every 6 (six) hours as needed for moderate pain or severe pain.        Discharge Assessment: Vitals:   08/09/21 0601 08/09/21 1207  BP: 94/63 105/79  Pulse: 87 99  Resp: 18 18  Temp: 98.7 F (37.1 C)   SpO2: 96% 95%   Skin clean, dry and intact without evidence of skin break down, no evidence of skin tears noted. IV catheter discontinued intact. Site without signs and symptoms of complications - no redness or edema noted at insertion site, patient denies c/o pain - only slight tenderness at site.  Dressing with slight pressure applied.  D/c Instructions-Education: Discharge  instructions given to patient/family with verbalized understanding. D/c education completed with patient/family including follow up instructions, medication list, d/c activities limitations if indicated, with other d/c instructions as indicated by MD - patient able to verbalize understanding, all questions fully answered. Patient instructed to return to ED, call 911, or call MD for any changes in condition.  Patient escorted via WC, and D/C home via private auto.  Cristal Ford, LPN 07/11/6268 35:00 PM

## 2021-08-09 NOTE — Discharge Summary (Signed)
Physician Discharge Summary   Patient: [name] MRN: [id number] DOB: 14-Jun-1985  Admit date:     08/06/2021  Discharge date: 08/09/21  Discharge Physician: Estill Cotta, MD    PCP: Wendi Maya, NP   Recommendations at discharge:   Continue Augmentin 875-125 mg p.o. twice daily for 10 more days  Discharge Diagnoses:    Diverticulitis of large intestine with micro perforation   Bipolar disorder ([place])   Sepsis ([place])   Hyperlipidemia   Tobacco abuse    Hospital Course: Patient is a 36 year old male with history of anxiety, bipolar disorder presented with 3-day history of lower abdominal pain.  Pain started after he strained to have a BM.  This pain is similar to the pain he experienced the last time he was in the hospital with colitis.  No fevers however felt chills.  Did have some associated nausea but no emesis.  Last BM a day before the admission.   CT abdomen pelvis showed acute diverticulitis of the descending distal colon with contained microperforation, small amount of free fluid without rim-enhancing fluid collection mild wall thickening within the mid sigmoid colon at the site of previous inflammation without associated inflammatory changes thought to be chronic diverticulitis.  Assessment and Plan:  Sepsis, diverticulitis of large intestine with micro perforation -Patient met sepsis criteria at the time of admission with tachycardia, leukocytosis, and febrile and diverticulitis -Patient was placed on IV Zosyn, n.p.o., IV fluid hydration, pain control -General surgery was consulted, recommended conservative management -Diet gradually advanced, leukocytosis improved, abdominal pain improving -Patient cleared for discharge home, continue oral Augmentin for 10 more days to complete full course of 14 days      Bipolar disorder ([place]) -Continue home psych medications, including Haldol, Depakote, Klonopin - supportive care     Sepsis ([place]) -As #1      Hyperlipidemia Continue Lipitor     Tobacco abuse -Continue nicotine patch     Pain control - [place] Controlled Substance Reporting System database was reviewed. and patient was instructed, not to drive, operate heavy machinery, perform activities at heights, swimming or participation in water activities or provide baby-sitting services while on Pain, Sleep and Anxiety Medications; until their outpatient Physician has advised to do so again. Also recommended to not to take more than prescribed Pain, Sleep and Anxiety Medications.  Consultants: General surgery Procedures performed: None Disposition: Home Diet recommendation:  Discharge Diet Orders (From admission, onward)     Start     Ordered   08/09/21 0000  Diet - low sodium heart healthy        08/09/21 1228           Cardiac diet DISCHARGE MEDICATION: Allergies as of 08/09/2021   No Known Allergies      Medication List     TAKE these medications    acetaminophen 500 MG tablet Commonly known as: TYLENOL Take 1,000 mg by mouth every 6 (six) hours as needed for mild pain or fever.   amoxicillin-clavulanate 875-125 MG tablet Commonly known as: AUGMENTIN Take 1 tablet by mouth 2 (two) times daily for 10 days.   atorvastatin 10 MG tablet Commonly known as: LIPITOR Take 10 mg by mouth at bedtime.   clonazePAM 0.5 MG tablet Commonly known as: KLONOPIN Take 0.5 mg by mouth at bedtime.   divalproex 500 MG DR tablet Commonly known as: DEPAKOTE Take 500 mg by mouth at bedtime.   haloperidol 10 MG tablet Commonly known as: HALDOL Take 10 mg  by mouth See admin instructions. Take 1 and 1/2 tablets every evening   hydrOXYzine 25 MG tablet Commonly known as: ATARAX Take 25 mg by mouth at bedtime as needed.   ondansetron 4 MG tablet Commonly known as: Zofran Take 1 tablet (4 mg total) by mouth every 8 (eight) hours as needed for nausea or vomiting.   traMADol 50 MG tablet Commonly known as: Ultram Take 1  tablet (50 mg total) by mouth every 6 (six) hours as needed for moderate pain or severe pain.        Follow-up Information     Wendi Maya, NP. Schedule an appointment as soon as possible for a visit in 2 week(s).   Why: for hospital follow-up Contact information: [address] [place] [address] [phone]         Rusty Aus, DO. Schedule an appointment as soon as possible for a visit in 1 month(s).   Specialty: General Surgery Why: Call to schedule a follow-up appointment in 1 month Contact information: [id number] [place] [facility] [id number] [phone]                Discharge Exam: S: Tolerating diet, wants to go home ASAP.  No fevers or chills, abdominal pain improving.  No nausea vomiting  Vitals:   08/08/21 1236 08/08/21 2109 08/09/21 0601 08/09/21 1207  BP: 126/77 116/82 94/63 105/79  Pulse: 99 96 87 99  Resp: 18 17 18 18   Temp: 98.3 F (36.8 C) 99 F (37.2 C) 98.7 F (37.1 C)   TempSrc:      SpO2: 97% 96% 96% 95%    Physical Exam General: Alert and oriented x 3, NAD Cardiovascular: S1 S2 clear, RRR. No pedal edema b/l Respiratory: CTAB, no wheezing, rales or rhonchi Gastrointestinal: Soft, nontender, nondistended, NBS Ext: no pedal edema bilaterally Neuro: no new deficits Skin: No rashes Psych: Normal affect and demeanor, alert and oriented x3    Condition at discharge: good  The results of significant diagnostics from this hospitalization (including imaging, microbiology, ancillary and laboratory) are listed below for reference.   Imaging Studies: CT ABDOMEN PELVIS W CONTRAST  Result Date: 08/06/2021 CLINICAL DATA:  Left lower quadrant abdominal pain EXAM: CT ABDOMEN AND PELVIS WITH CONTRAST TECHNIQUE: Multidetector CT imaging of the abdomen and pelvis was performed using the standard protocol following bolus administration of intravenous contrast. RADIATION DOSE REDUCTION: This exam was performed according to the  departmental dose-optimization program which includes automated exposure control, adjustment of the mA and/or kV according to patient size and/or use of iterative reconstruction technique. CONTRAST:  166m OMNIPAQUE IOHEXOL 300 MG/ML  SOLN COMPARISON:  04/01/2021 FINDINGS: Lower chest: No acute abnormality. Hepatobiliary: No focal liver abnormality is seen. No gallstones, gallbladder wall thickening, or biliary dilatation. Pancreas: Unremarkable. No pancreatic ductal dilatation or surrounding inflammatory changes. Spleen: Normal in size without focal abnormality. Adrenals/Urinary Tract: Unremarkable adrenal glands. Kidneys enhance symmetrically without focal lesion, stone, or hydronephrosis. Ureters are nondilated. Urinary bladder is decompressed, limiting its evaluation. Stomach/Bowel: Stomach within normal limits. No dilated loops of bowel. Focally thickened, inflamed segment of distal descending colon containing inflamed appearing diverticula. Multiple foci of extraluminal air adjacent to the segment compatible with a contained micro perforation (series 2, image 72). Extensive surrounding pericolonic fat stranding and a small amount of fluid within the left pericolic gutter. No well-defined or rim enhancing fluid collection is present at this time. Mild wall thickening within the mid sigmoid colon at site of previously seen inflammation  without associated inflammatory changes, likely related to chronic diverticulitis. No additional sites of focal bowel wall thickening. Vascular/Lymphatic: No significant vascular findings are present. No enlarged abdominal or pelvic lymph nodes. Reproductive: Prostate is unremarkable. Other: No significant volume of pneumoperitoneum within the abdomen. No abdominal wall hernia. Musculoskeletal: No acute or significant osseous findings. IMPRESSION: 1. Acute diverticulitis of the distal descending colon with evidence of a contained micro perforation. Adjacent ill-defined free fluid,  however no well-defined or rim-enhancing fluid collection is present at this time. 2. Mild wall thickening within the mid sigmoid colon at site of previously seen inflammation without associated inflammatory changes, likely related to chronic diverticulitis. These results were called by telephone at the time of interpretation on 08/06/2021 at 12:03 pm to provider [facility] , who verbally acknowledged these results. Electronically Signed   By: Davina Poke D.O.   On: 08/06/2021 12:08   DG Chest Port 1 View  Result Date: 08/06/2021 CLINICAL DATA:  Question sepsis EXAM: PORTABLE CHEST 1 VIEW COMPARISON:  None Available. FINDINGS: Heart size and vascularity normal. Lungs clear without infiltrate or effusion. No acute skeletal abnormality. IMPRESSION: No active disease. Electronically Signed   By: Franchot Gallo M.D.   On: 08/06/2021 12:50    Microbiology: Results for orders placed or performed during the hospital encounter of 08/06/21  Blood Culture (routine x 2)     Status: None (Preliminary result)   Collection Time: 08/06/21 12:54 PM   Specimen: BLOOD RIGHT ARM  Result Value Ref Range Status   Specimen Description BLOOD RIGHT ARM  Final   Special Requests   Final    BOTTLES DRAWN AEROBIC AND ANAEROBIC Blood Culture adequate volume   Culture   Final    NO GROWTH 2 DAYS Performed at [facility], [address], [place], [place] [address]    Report Status PENDING  Incomplete  Blood Culture (routine x 2)     Status: None (Preliminary result)   Collection Time: 08/06/21 12:54 PM   Specimen: BLOOD LEFT HAND  Result Value Ref Range Status   Specimen Description BLOOD LEFT HAND  Final   Special Requests   Final    BOTTLES DRAWN AEROBIC AND ANAEROBIC Blood Culture adequate volume   Culture   Final    NO GROWTH 2 DAYS Performed at [facility], [address], [place], [place] [address]    Report Status PENDING  Incomplete    Labs: CBC: Recent Labs  Lab 08/06/21 1057 08/07/21 0433  08/08/21 0425 08/09/21 0451  WBC 16.2* 17.3* 11.4* 7.3  HGB 15.5 13.4 13.0 13.6  HCT 46.5 40.7 39.6 41.5  MCV 93.8 96.4 95.7 95.2  PLT 236 223 232 767   Basic Metabolic Panel: Recent Labs  Lab 08/06/21 1057 08/06/21 1254 08/07/21 0433 08/08/21 0425 08/09/21 0451  NA 136  --  137 137 141  K 3.6  --  4.0 3.6 3.6  CL 103  --  104 104 105  CO2 27  --  27 27 27   GLUCOSE 101*  --  112* 100* 95  BUN 6  --  6 6 <5*  CREATININE 0.86 0.90 0.87 0.74 0.84  CALCIUM 8.6*  --  8.3* 8.5* 8.6*  MG  --   --  1.9  --   --   PHOS  --   --  3.3  --   --    Liver Function Tests: Recent Labs  Lab 08/06/21 1057 08/07/21 0433  AST 15 11*  ALT 15  12  ALKPHOS 56 49  BILITOT 0.3 0.1*  PROT 7.3 6.2*  ALBUMIN 3.7 3.0*   CBG: No results for input(s): GLUCAP in the last 168 hours.  Discharge time spent: greater than 30 minutes.  Signed: Estill Cotta, MD [facility] 08/09/2021

## 2021-08-11 LAB — CULTURE, BLOOD (ROUTINE X 2)
Culture: NO GROWTH
Culture: NO GROWTH
Special Requests: ADEQUATE
Special Requests: ADEQUATE

## 2021-09-24 ENCOUNTER — Encounter: Payer: Self-pay | Admitting: Surgery

## 2021-09-24 ENCOUNTER — Ambulatory Visit (INDEPENDENT_AMBULATORY_CARE_PROVIDER_SITE_OTHER): Payer: Medicaid Other | Admitting: Surgery

## 2021-09-24 VITALS — BP 121/84 | HR 100 | Temp 97.9°F | Resp 14 | Ht 77.0 in | Wt 254.0 lb

## 2021-09-24 DIAGNOSIS — Z8719 Personal history of other diseases of the digestive system: Secondary | ICD-10-CM | POA: Diagnosis not present

## 2021-09-24 MED ORDER — DOCUSATE SODIUM 100 MG PO CAPS: 100.0000 mg | ORAL_CAPSULE | Freq: Two times a day (BID) | ORAL | 0 refills | Status: DC

## 2021-09-24 NOTE — Progress Notes (Signed)
[facility] Note   HPI:  36 y.o. Male presents to clinic for follow-up evaluation after second admission for diverticulitis.  He was recently admitted to the hospital on 5/30 for an episode of diverticulitis with contained microperforation.  He was able to be managed conservatively.  Since discharge from the hospital, he had a small flare of what he believes was diverticulitis last week.  He did not take any antibiotics or see anyone for this; he only stopped eating food for a couple of days.  He denies any fevers or chills.  He is now tolerating a diet without nausea and vomiting.  He had a normal bowel movement this morning.  He denies any abdominal pain at this time.  Review of Systems:  All other review of systems: otherwise negative   Vital Signs:  BP 121/84   Pulse 100   Temp 97.9 F (36.6 C) (Oral)   Resp 14   Ht 6\' 5"  (1.956 m)   Wt 254 lb (115.2 kg)   SpO2 97%   BMI 30.12 kg/m    Physical Exam:  Physical Exam Vitals reviewed.  Constitutional:      Appearance: Normal appearance.  Abdominal:     Comments: Abdomen soft, nondistended, no percussion tenderness, nontender to palpation; no rigidity, guarding, or rebound tenderness  Neurological:     Mental Status: He is alert.    Laboratory studies: None  Imaging:  None  Assessment:  36 y.o. yo Male who presents for follow-up status post hospitalization for diverticulitis with microperforation, managed conservatively  Plan:  -Patient expresses that at this time, he does not want to proceed with an elective colon resection -I explained that given that he has had 2 admissions in the last 6 months with complicated diverticulitis, that he is at increased risk for repeat admission to the hospital with additional episodes, and he is also at risk for requiring emergent surgery.  He is understanding of this, and would like to monitor for further episodes without scheduling surgery -Advised the patient that he  should be taking a stool softener twice daily to decrease risk of constipation, as this seems to precipitate his episodes.  Prescription provided for Colace -Also advised patient to use laxatives if he is still developing constipation -Follow up as needed if worsening issues  All of the above recommendations were discussed with the patient, and all of patient's questions were answered to his expressed satisfaction.  20, DO [facility] [address] [place], [place] [phone] (office)

## 2021-09-24 NOTE — Patient Instructions (Addendum)
-  Take a stool softener twice daily to decrease constipation -Take an over the counter laxative as needed for constipation -Follow up with me as needed for recurrent diverticulitis episodes

## 2022-04-11 ENCOUNTER — Emergency Department (HOSPITAL_COMMUNITY)
Admission: EM | Admit: 2022-04-11 | Discharge: 2022-04-11 | Disposition: A | Discharge status: Home / Self Care | Payer: Medicaid Other | Attending: Emergency Medicine | Admitting: Emergency Medicine

## 2022-04-11 ENCOUNTER — Other Ambulatory Visit: Payer: Self-pay

## 2022-04-11 ENCOUNTER — Emergency Department (HOSPITAL_COMMUNITY): Payer: Medicaid Other

## 2022-04-11 DIAGNOSIS — A419 Sepsis, unspecified organism: Secondary | ICD-10-CM

## 2022-04-11 DIAGNOSIS — K5732 Diverticulitis of large intestine without perforation or abscess without bleeding: Secondary | ICD-10-CM | POA: Diagnosis not present

## 2022-04-11 DIAGNOSIS — R1032 Left lower quadrant pain: Secondary | ICD-10-CM | POA: Diagnosis present

## 2022-04-11 DIAGNOSIS — K5792 Diverticulitis of intestine, part unspecified, without perforation or abscess without bleeding: Secondary | ICD-10-CM

## 2022-04-11 DIAGNOSIS — R112 Nausea with vomiting, unspecified: Secondary | ICD-10-CM

## 2022-04-11 LAB — URINALYSIS, ROUTINE W REFLEX MICROSCOPIC
Bilirubin Urine: NEGATIVE
Glucose, UA: NEGATIVE mg/dL
Hgb urine dipstick: NEGATIVE
Ketones, ur: NEGATIVE mg/dL
Leukocytes,Ua: NEGATIVE
Nitrite: NEGATIVE
Protein, ur: NEGATIVE mg/dL
Specific Gravity, Urine: 1.024 (ref 1.005–1.030)
pH: 5 (ref 5.0–8.0)

## 2022-04-11 LAB — CBC WITH DIFFERENTIAL/PLATELET
Abs Immature Granulocytes: 0.06 10*3/uL (ref 0.00–0.07)
Basophils Absolute: 0.1 10*3/uL (ref 0.0–0.1)
Basophils Relative: 0 %
Eosinophils Absolute: 0 10*3/uL (ref 0.0–0.5)
Eosinophils Relative: 0 %
HCT: 46.6 % (ref 39.0–52.0)
Hemoglobin: 15.8 g/dL (ref 13.0–17.0)
Immature Granulocytes: 0 %
Lymphocytes Relative: 15 %
Lymphs Abs: 2.3 10*3/uL (ref 0.7–4.0)
MCH: 31.2 pg (ref 26.0–34.0)
MCHC: 33.9 g/dL (ref 30.0–36.0)
MCV: 92.1 fL (ref 80.0–100.0)
Monocytes Absolute: 2.1 10*3/uL — ABNORMAL HIGH (ref 0.1–1.0)
Monocytes Relative: 14 %
Neutro Abs: 10.6 10*3/uL — ABNORMAL HIGH (ref 1.7–7.7)
Neutrophils Relative %: 71 %
Platelets: 272 10*3/uL (ref 150–400)
RBC: 5.06 MIL/uL (ref 4.22–5.81)
RDW: 12.9 % (ref 11.5–15.5)
WBC: 15.2 10*3/uL — ABNORMAL HIGH (ref 4.0–10.5)
nRBC: 0 % (ref 0.0–0.2)

## 2022-04-11 LAB — COMPREHENSIVE METABOLIC PANEL
ALT: 21 U/L (ref 0–44)
AST: 19 U/L (ref 15–41)
Albumin: 3.7 g/dL (ref 3.5–5.0)
Alkaline Phosphatase: 56 U/L (ref 38–126)
Anion gap: 10 (ref 5–15)
BUN: 7 mg/dL (ref 6–20)
CO2: 26 mmol/L (ref 22–32)
Calcium: 8.6 mg/dL — ABNORMAL LOW (ref 8.9–10.3)
Chloride: 100 mmol/L (ref 98–111)
Creatinine, Ser: 0.89 mg/dL (ref 0.61–1.24)
GFR, Estimated: >60 mL/min (ref >60)
Glucose, Bld: 101 mg/dL — ABNORMAL HIGH (ref 70–99)
Potassium: 3.5 mmol/L (ref 3.5–5.1)
Sodium: 136 mmol/L (ref 135–145)
Total Bilirubin: 0.6 mg/dL (ref 0.3–1.2)
Total Protein: 7.3 g/dL (ref 6.5–8.1)

## 2022-04-11 LAB — LIPASE, BLOOD: Lipase: 31 U/L (ref 11–51)

## 2022-04-11 MED ORDER — SODIUM CHLORIDE 0.9 % IV SOLN
3.0000 g | Freq: Once | INTRAVENOUS | Status: AC
  Administered 2022-04-11: 3 g via INTRAVENOUS
  Filled 2022-04-11: qty 8

## 2022-04-11 MED ORDER — MORPHINE SULFATE (PF) 4 MG/ML IV SOLN
6.0000 mg | Freq: Once | INTRAVENOUS | Status: AC
  Administered 2022-04-11: 6 mg via INTRAVENOUS
  Filled 2022-04-11: qty 2

## 2022-04-11 MED ORDER — ONDANSETRON HCL 4 MG/2ML IJ SOLN: 4.0000 mg | Freq: Once | INTRAMUSCULAR | Status: AC

## 2022-04-11 MED ORDER — AMOXICILLIN-POT CLAVULANATE 875-125 MG PO TABS: 1.0000 | ORAL_TABLET | Freq: Two times a day (BID) | ORAL | 0 refills | Status: AC

## 2022-04-11 MED ORDER — IOHEXOL 300 MG/ML  SOLN
100.0000 mL | Freq: Once | INTRAMUSCULAR | Status: AC | PRN
  Administered 2022-04-11: 100 mL via INTRAVENOUS

## 2022-04-11 MED ORDER — ONDANSETRON 4 MG PO TBDP: 4.0000 mg | ORAL_TABLET | Freq: Three times a day (TID) | ORAL | 0 refills | Status: AC | PRN

## 2022-04-11 MED ORDER — LACTATED RINGERS IV BOLUS
1000.0000 mL | Freq: Once | INTRAVENOUS | Status: AC
  Administered 2022-04-11: 1000 mL via INTRAVENOUS

## 2022-04-11 MED ORDER — SODIUM CHLORIDE 0.9 % IV BOLUS
1000.0000 mL | Freq: Once | INTRAVENOUS | Status: AC
  Administered 2022-04-11: 1000 mL via INTRAVENOUS

## 2022-04-11 MED ORDER — MORPHINE SULFATE (PF) 4 MG/ML IV SOLN: 6.0000 mg | Freq: Once | INTRAVENOUS | Status: AC

## 2022-04-11 MED ORDER — MORPHINE SULFATE (PF) 4 MG/ML IV SOLN
INTRAVENOUS | Status: AC
  Administered 2022-04-11: 6 mg via INTRAVENOUS
  Filled 2022-04-11: qty 2

## 2022-04-11 MED ORDER — OXYCODONE HCL 5 MG PO TABS: 5.0000 mg | ORAL_TABLET | ORAL | 0 refills | Status: DC | PRN

## 2022-04-11 MED ORDER — ONDANSETRON HCL 4 MG/2ML IJ SOLN
INTRAMUSCULAR | Status: AC
  Administered 2022-04-11: 4 mg via INTRAVENOUS
  Filled 2022-04-11: qty 2

## 2022-04-11 NOTE — ED Provider Notes (Signed)
[facility] Provider Note   CSN: [id number] Arrival date & time: 04/11/22  7169     History  Chief Complaint  Patient presents with   Abdominal Pain    [name] is a 37 y.o. male.  37 year old male with history of diverticulosis and diverticulitis with microperforation who presents to the emergency department with abdominal pain.  Has had 2 days of sharp stabbing intermittent abdominal pain.  Describes it as left lower quadrant and left mid abdomen.  Says that it comes and goes.  Worsened by eating and having bowel movements.  Having subjective fevers at home.  Has had multiple episodes of nonbloody nonbilious emesis.  Denies any dysuria or frequency.  No abdominal surgeries.  In 2023 did have diverticulitis with microperforation.  Also had colonoscopy that showed diverticulosis without any additional findings.       Home Medications Prior to Admission medications   Medication Sig Start Date End Date Taking? Authorizing Provider  amoxicillin-clavulanate (AUGMENTIN) 875-125 MG tablet Take 1 tablet by mouth every 12 (twelve) hours for 10 days. 04/11/22 04/21/22 Yes Fransico Meadow, MD  atorvastatin (LIPITOR) 10 MG tablet Take 10 mg by mouth at bedtime. 06/01/21  Yes [provider]  clonazePAM (KLONOPIN) 0.5 MG tablet Take 0.5 mg by mouth at bedtime. 03/30/21  Yes [provider]  divalproex (DEPAKOTE) 500 MG DR tablet Take 500 mg by mouth at bedtime. 03/27/21  Yes [provider]  haloperidol (HALDOL) 10 MG tablet Take 15 mg by mouth daily. 03/15/21  Yes [provider]  hydrOXYzine (ATARAX) 25 MG tablet Take 10-25 mg by mouth at bedtime as needed for anxiety. 10mg  during the day as needed, 25mg  at night as needed 07/29/21  Yes [provider]  ondansetron (ZOFRAN) 4 MG tablet Take 1 tablet (4 mg total) by mouth every 8 (eight) hours as needed for nausea or vomiting. 08/09/21 08/09/22 Yes Rai,  Ripudeep K, MD  ondansetron (ZOFRAN-ODT) 4 MG disintegrating tablet Take 1 tablet (4 mg total) by mouth every 8 (eight) hours as needed for nausea or vomiting. 04/11/22  Yes Fransico Meadow, MD  oxyCODONE (ROXICODONE) 5 MG immediate release tablet Take 1 tablet (5 mg total) by mouth every 4 (four) hours as needed for severe pain. 04/11/22  Yes Fransico Meadow, MD  acetaminophen (TYLENOL) 500 MG tablet Take 1,000 mg by mouth every 6 (six) hours as needed for mild pain or fever.    [provider]      Allergies    Patient has no known allergies.    Review of Systems   Review of Systems  Physical Exam Updated Vital Signs BP 120/86   Pulse (!) 105   Temp 98.6 F (37 C)   Resp 18   Ht 6\' 3"  (1.905 m)   Wt 113.4 kg   SpO2 96%   BMI 31.25 kg/m  Physical Exam Vitals and nursing note reviewed.  Constitutional:      General: He is not in acute distress.    Appearance: He is well-developed.  HENT:     Head: Normocephalic and atraumatic.     Right Ear: External ear normal.     Left Ear: External ear normal.     Nose: Nose normal.  Eyes:     Extraocular Movements: Extraocular movements intact.     Conjunctiva/sclera: Conjunctivae normal.     Pupils: Pupils are equal, round, and reactive to light.  Cardiovascular:  Rate and Rhythm: Regular rhythm. Tachycardia present.  Pulmonary:     Effort: Pulmonary effort is normal. No respiratory distress.  Abdominal:     General: There is no distension.     Palpations: Abdomen is soft. There is no mass.     Tenderness: There is abdominal tenderness (Left mid abdomen). There is left CVA tenderness. There is no right CVA tenderness or guarding.  Musculoskeletal:     Cervical back: Normal range of motion and neck supple.  Skin:    General: Skin is warm and dry.  Neurological:     Mental Status: He is alert. Mental status is at baseline.  Psychiatric:        Mood and Affect: Mood normal.        Behavior: Behavior normal.      ED Results / Procedures / Treatments   Labs (all labs ordered are listed, but only abnormal results are displayed) Labs Reviewed  COMPREHENSIVE METABOLIC PANEL - Abnormal; Notable for the following components:      Result Value   Glucose, Bld 101 (*)    Calcium 8.6 (*)    All other components within normal limits  CBC WITH DIFFERENTIAL/PLATELET - Abnormal; Notable for the following components:   WBC 15.2 (*)    Neutro Abs 10.6 (*)    Monocytes Absolute 2.1 (*)    All other components within normal limits  CULTURE, BLOOD (ROUTINE X 2)  CULTURE, BLOOD (ROUTINE X 2)  LIPASE, BLOOD  URINALYSIS, ROUTINE W REFLEX MICROSCOPIC    EKG None  Radiology CT ABDOMEN PELVIS W CONTRAST  Result Date: 04/11/2022 CLINICAL DATA:  Lower abdominal pain for 2 days. Nausea. History of diverticulitis. EXAM: CT ABDOMEN AND PELVIS WITH CONTRAST TECHNIQUE: Multidetector CT imaging of the abdomen and pelvis was performed using the standard protocol following bolus administration of intravenous contrast. RADIATION DOSE REDUCTION: This exam was performed according to the departmental dose-optimization program which includes automated exposure control, adjustment of the mA and/or kV according to patient size and/or use of iterative reconstruction technique. CONTRAST:  125mL OMNIPAQUE IOHEXOL 300 MG/ML  SOLN COMPARISON:  08/06/2021 FINDINGS: Lower chest: Unremarkable. Hepatobiliary: No suspicious focal abnormality within the liver parenchyma. There is no evidence for gallstones, gallbladder wall thickening, or pericholecystic fluid. No intrahepatic or extrahepatic biliary dilation. Pancreas: No focal mass lesion. No dilatation of the main duct. No intraparenchymal cyst. No peripancreatic edema. Spleen: No splenomegaly. No focal mass lesion. Adrenals/Urinary Tract: No adrenal nodule or mass. Kidneys unremarkable. No evidence for hydroureter. The urinary bladder appears normal for the degree of distention.  Stomach/Bowel: Stomach is unremarkable. No gastric wall thickening. No evidence of outlet obstruction. Duodenum is normally positioned as is the ligament of Treitz. No small bowel wall thickening. No small bowel dilatation. The terminal ileum is normal. The appendix is normal. Diverticular changes are seen in the left colon. Approximally 10 cm segment of the descending colon shows circumferential wall thickening and edema with substantial pericolonic edema/inflammation. These changes are in the region of diverticulosis in there appears to be an enlarged inflamed anterior diverticulum on image 50/2. No evidence for extraluminal gas and no adjacent abscess. There is some trace fluid layering in the left paracolic gutter. Vascular/Lymphatic: No abdominal aortic aneurysm. No abdominal aortic atherosclerotic calcification. Multiple small retroperitoneal lymph nodes evident without findings to suggest retroperitoneal lymphadenopathy. No pelvic sidewall lymphadenopathy. Reproductive: The prostate gland and seminal vesicles are unremarkable. Other: There is some trace fluid in the pelvis. Musculoskeletal: No worrisome lytic or  sclerotic osseous abnormality. IMPRESSION: 1. 10 cm segment of the descending colon shows circumferential wall thickening and edema with substantial pericolonic edema/inflammation. These changes are in the region of diverticulosis and there appears to be an enlarged inflamed anterior diverticulum. Imaging features are most suggestive of acute diverticulitis. No evidence for extraluminal gas and no para colonic abscess. 2. Trace fluid layering in the left paracolic gutter and pelvis. Electronically Signed   By: Misty Stanley M.D.   On: 04/11/2022 10:53    Procedures Procedures   Medications Ordered in ED Medications  morphine (PF) 4 MG/ML injection 6 mg (6 mg Intravenous Given 04/11/22 0840)  ondansetron (ZOFRAN) injection 4 mg (4 mg Intravenous Given 04/11/22 0839)  lactated ringers bolus 1,000  mL (0 mLs Intravenous Stopped 04/11/22 0940)  iohexol (OMNIPAQUE) 300 MG/ML solution 100 mL (100 mLs Intravenous Contrast Given 04/11/22 1035)  morphine (PF) 4 MG/ML injection 6 mg (6 mg Intravenous Given 04/11/22 1109)  Ampicillin-Sulbactam (UNASYN) 3 g in sodium chloride 0.9 % 100 mL IVPB (0 g Intravenous Stopped 04/11/22 1141)  sodium chloride 0.9 % bolus 1,000 mL (0 mLs Intravenous Stopped 04/11/22 1237)    ED Course/ Medical Decision Making/ A&P                            Medical Decision Making Amount and/or Complexity of Data Reviewed Labs: ordered. Radiology: ordered.  Risk Prescription drug management.   [name] is a 37 y.o. male with comorbidities that complicate the patient evaluation including diverticulitis who presents emergency department with abdominal pain and nausea and vomiting  Initial Ddx:  Diverticulitis, gastroenteritis, kidney stone, pyelonephritis  MDM:  Feel the patient likely has diverticulitis given his history so we will obtain contrasted CT scan.  If negative may be reflective of gastroenteritis.  Also considered kidney stone given the patient's location of pain.  Not appearing to have any urinary symptoms at this time that suggest pyelonephritis but will check urine and follow-up with CT for this.  Plan:  Labs Lipase Urinalysis CT abdomen pelvis IV contrast Fluids Zofran Morphine  ED Summary/Re-evaluation:  Patient required several doses of pain medication in the emergency department.  Was given 2 fluid boluses.  Started on IV Unasyn as soon as a CT scan showed diverticulitis without perforation.  Patient was requesting to go home.  Heart rate had improved to the low 100s which he states is his baseline typically.  He was able to tolerate p.o. in the emergency department.  Cultures were sent and were pending.  Instructed the patient to follow-up with his primary doctor regarding his symptoms and was sent home with Augmentin, Zofran, and oxycodone  for any breakthrough pain that he has.  Strict return precautions discussed with the patient and his mother.  This patient presents to the ED for concern of complaints listed in HPI, this involves an extensive number of treatment options, and is a complaint that carries with it a high risk of complications and morbidity. Disposition including potential need for admission considered.   Dispo: DC Home. Return precautions discussed including, but not limited to, those listed in the AVS. Allowed pt time to ask questions which were answered fully prior to dc.  Additional history obtained from mother Records reviewed Outpatient Clinic Notes The following labs were independently interpreted: Urinalysis and show no acute abnormality I independently reviewed the following imaging with scope of interpretation limited to determining acute life threatening conditions related to  emergency care: CT Abdomen/Pelvis and agree with the radiologist interpretation with the following exceptions: None I personally reviewed and interpreted cardiac monitoring: sinus tachycardia I personally reviewed and interpreted the pt's EKG: see above for interpretation  I have reviewed the patients home medications and made adjustments as needed  Final Clinical Impression(s) / ED Diagnoses Final diagnoses:  Diverticulitis  Nausea and vomiting, unspecified vomiting type  Sepsis, due to unspecified organism, unspecified whether acute organ dysfunction present [facility])    Rx / DC Orders ED Discharge Orders          Ordered    ondansetron (ZOFRAN-ODT) 4 MG disintegrating tablet  Every 8 hours PRN        04/11/22 1248    amoxicillin-clavulanate (AUGMENTIN) 875-125 MG tablet  Every 12 hours        04/11/22 1311    oxyCODONE (ROXICODONE) 5 MG immediate release tablet  Every 4 hours PRN        04/11/22 1311           CRITICAL CARE Performed by: Rondel Baton   Total critical care time: 30 minutes  Critical care  time was exclusive of separately billable procedures and treating other patients.  Critical care was necessary to treat or prevent imminent or life-threatening deterioration.  Critical care was time spent personally by me on the following activities: development of treatment plan with patient and/or surrogate as well as nursing, discussions with consultants, evaluation of patient's response to treatment, examination of patient, obtaining history from patient or surrogate, ordering and performing treatments and interventions, ordering and review of laboratory studies, ordering and review of radiographic studies, pulse oximetry and re-evaluation of patient's condition.    Rondel Baton, MD 04/11/22 [phone]

## 2022-04-11 NOTE — Discharge Instructions (Addendum)
You were seen for diverticulitis in the emergency department.   At home, please take the antibiotics, pain medication, nausea medication we prescribed you.  Take Tylenol and ibuprofen for your pain. You may also take the oxycodone we have prescribed you for any breakthrough pain that may have.  Do not take this before driving or operating heavy machinery.  Do not take this medication with alcohol.  Follow-up with your primary doctor in 2-3 days regarding your visit.    Return immediately to the emergency department if you experience any of the following: Worsening pain, dizziness or lightheadedness, or any other concerning symptoms.    Thank you for visiting our Emergency Department. It was a pleasure taking care of you today.

## 2022-04-11 NOTE — ED Triage Notes (Signed)
Pt accompanied by grandpa c/o lower abdominal pain x 2 days, reports nausea. Last BM yesterday. H/O diverticulitis, bipolar.

## 2022-04-17 LAB — CULTURE, BLOOD (ROUTINE X 2)
Culture: NO GROWTH
Culture: NO GROWTH

## 2023-05-31 IMAGING — CT CT ABD-PELV W/ CM
2 of 4 series · 16 of 46 positions shown, 18 images · IV contrast (agent unspecified)
Comparison: None.

CLINICAL DATA: Lower abdominal pain, blood in stool.

EXAM:
CT ABDOMEN AND PELVIS WITH CONTRAST
TECHNIQUE: Multidetector CT imaging of the abdomen and pelvis was performed
using the standard protocol following bolus administration of
intravenous contrast.

[Series 2: axial st · axial · 0.94mm/px · z∈[+886,+1422]mm · 13 of 119 slices shown, 15 images]
[im 6/119  soft-tissue]
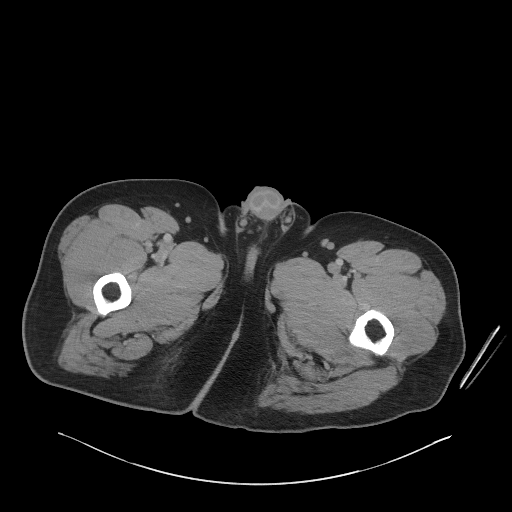
[im 6/119  bone]
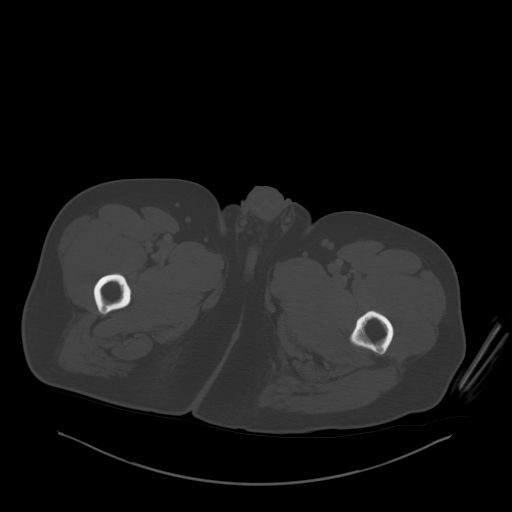
[im 16/119  soft-tissue]
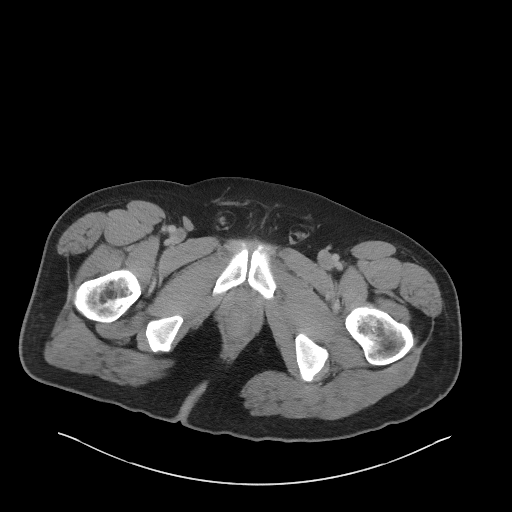
[im 26/119  soft-tissue]
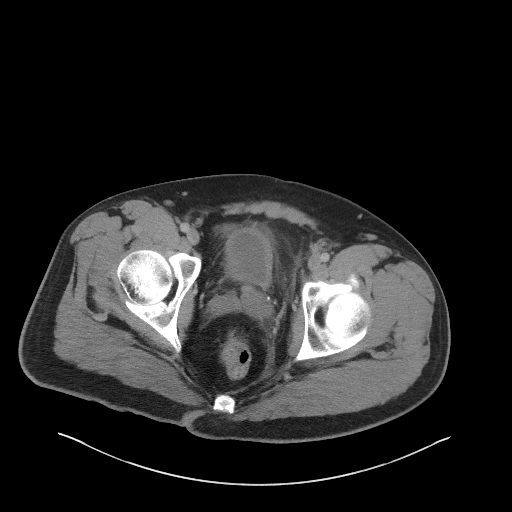
[im 31/119  soft-tissue]
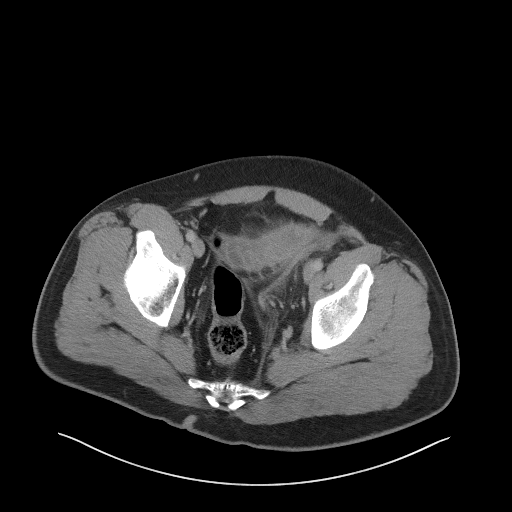
[im 42/119  soft-tissue]
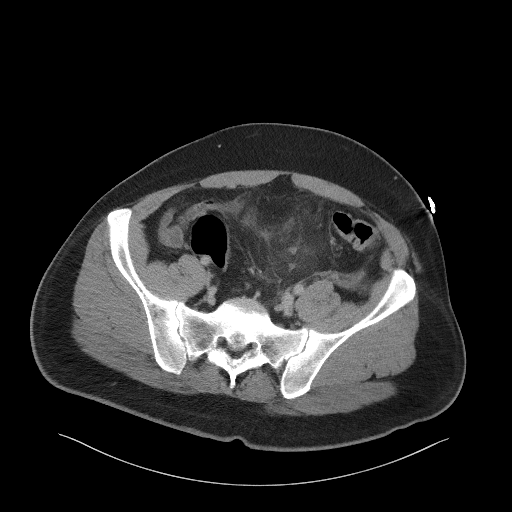
[im 52/119  soft-tissue]
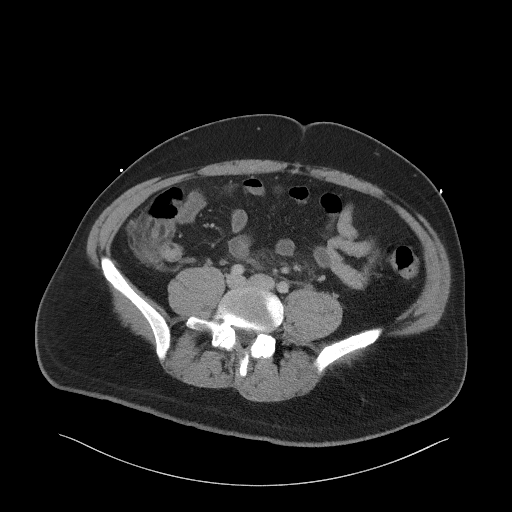
[im 62/119  soft-tissue]
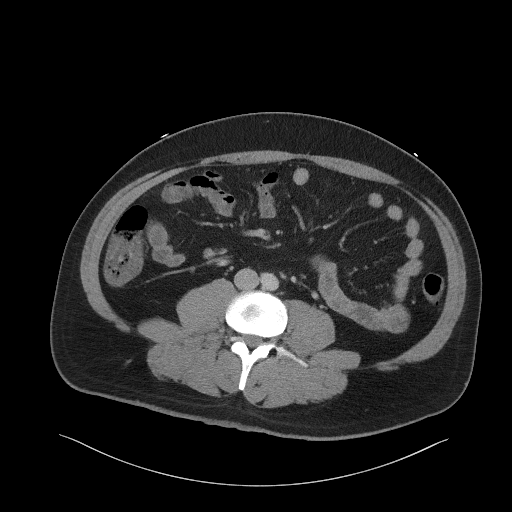
[im 67/119  soft-tissue]
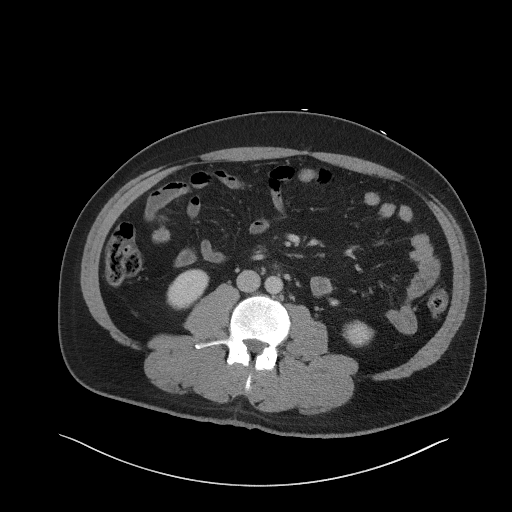
[im 77/119  soft-tissue]
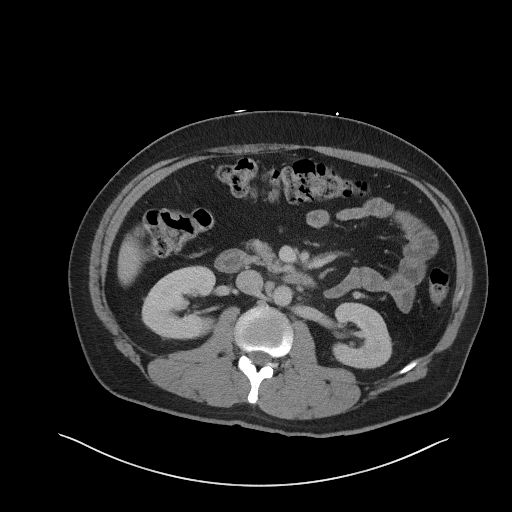
[im 77/119  bone]
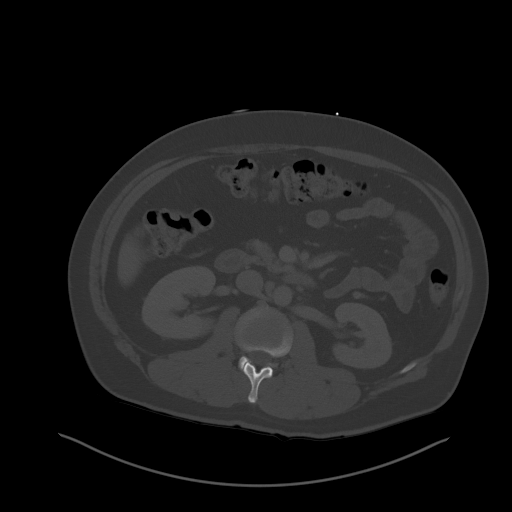
[im 88/119  soft-tissue]
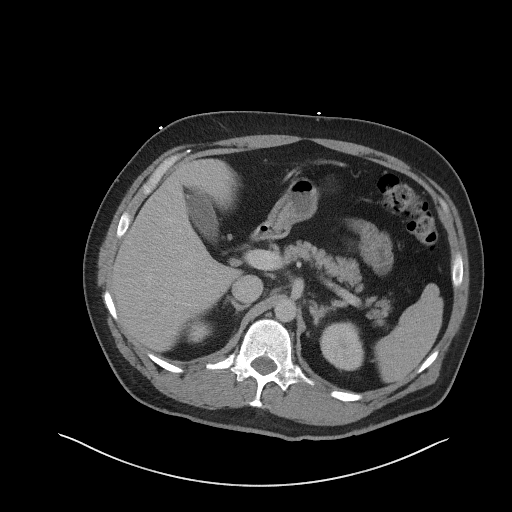
[im 93/119  soft-tissue]
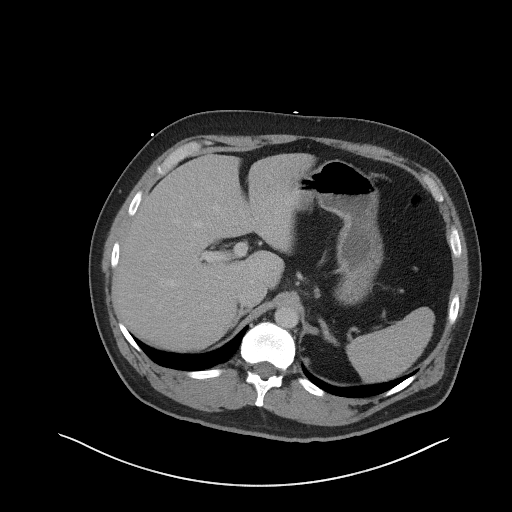
[im 103/119  soft-tissue]
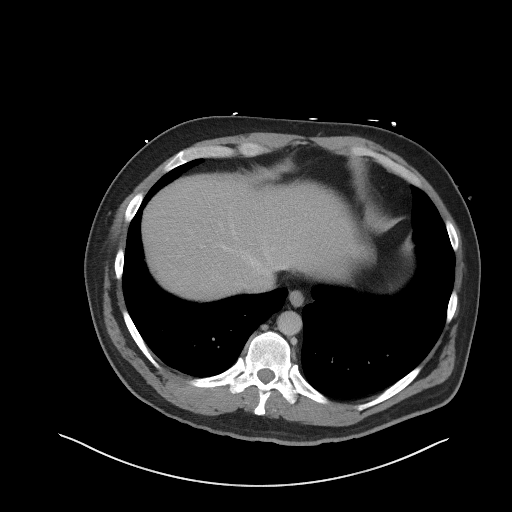
[im 113/119  soft-tissue]
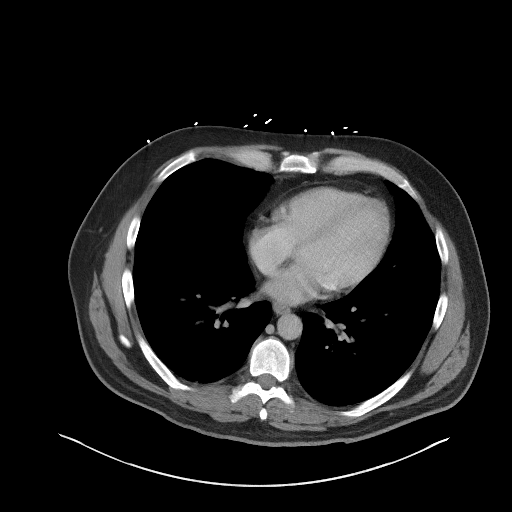

[Series 5: coronal st · coronal · 0.89mm/px · 3 of 108 slices shown]
[im 36/108  soft-tissue]
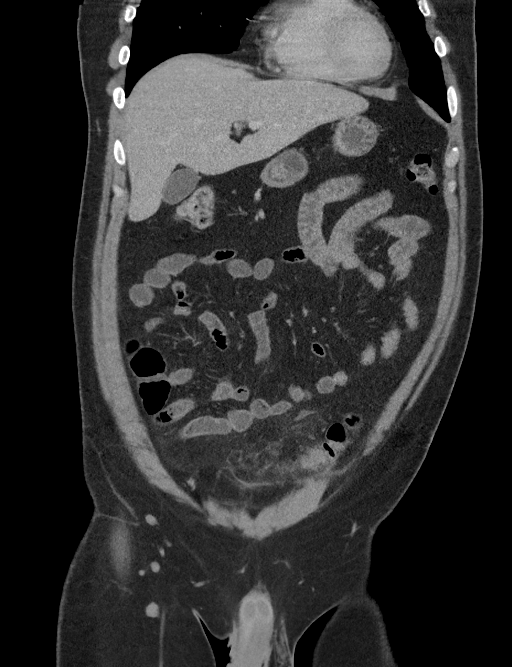
[im 48/108  soft-tissue]
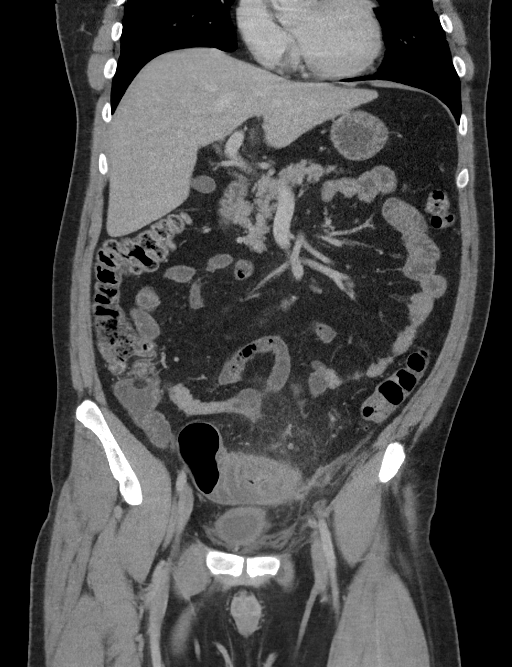
[im 60/108  soft-tissue]
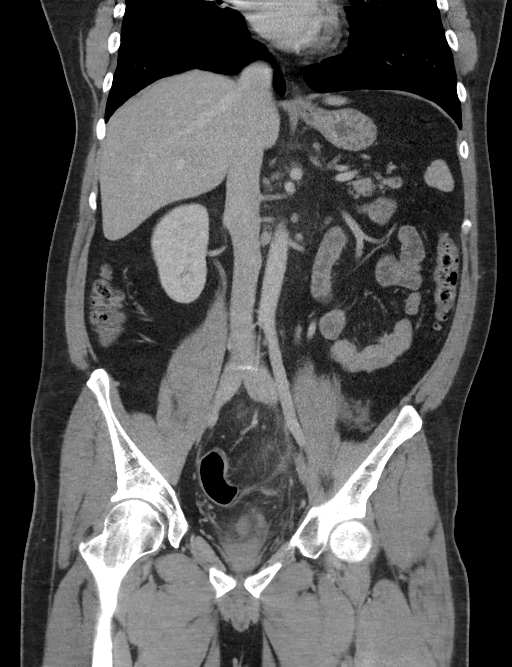

[16 of 46 positions shown; findings below may reference images not displayed]

RADIATION DOSE REDUCTION: This exam was performed according to the
departmental dose-optimization program which includes automated
exposure control, adjustment of the mA and/or kV according to
patient size and/or use of iterative reconstruction technique.

CONTRAST:  100mL OMNIPAQUE IOHEXOL 300 MG/ML  SOLN
FINDINGS: Lower chest: No acute abnormality.

Hepatobiliary: No focal hepatic abnormality. Gallbladder
unremarkable.

Pancreas: No focal abnormality or ductal dilatation.

Spleen: No focal abnormality.  Normal size.

Adrenals/Urinary Tract: No adrenal abnormality. No focal renal
abnormality. No stones or hydronephrosis. Urinary bladder is
unremarkable.

Stomach/Bowel: There is long segment wall thickening in the sigmoid
colon with surrounding inflammation/stranding. A few locules of
extraluminal gas are noted posteriorly with a small amount of fluid
concerning for micro perforation. Small fluid collection measures up
to 2.5 cm. Given the long segment of involvement and lack of visible
diverticula, this is presumably related to colitis. Stomach and
small bowel decompressed, unremarkable. Appendix is normal.

Vascular/Lymphatic: No evidence of aneurysm or adenopathy.

Reproductive: No visible focal abnormality.

Other: No free fluid or free air.

Musculoskeletal: No acute bony abnormality.
IMPRESSION: Long segment wall thickening and inflammation involving the mid
sigmoid colon compatible with colitis. Few locules of extraluminal
gas and small fluid collection noted posteriorly compatible with
micro perforation.

These results were called by telephone at the time of interpretation
on 04/01/2021 at [DATE] to provider Lkw Tiger, who verbally
acknowledged these results.

## 2023-10-05 IMAGING — CT CT ABD-PELV W/ CM
2 of 4 series · 16 of 46 positions shown, 18 images · IV contrast (Omnipaque or Isovue)
Comparison: 04/01/2021

CLINICAL DATA: Left lower quadrant abdominal pain

EXAM:
CT ABDOMEN AND PELVIS WITH CONTRAST
TECHNIQUE: Multidetector CT imaging of the abdomen and pelvis was performed
using the standard protocol following bolus administration of
intravenous contrast.

[Series 2: axial st · axial · 0.80mm/px · z∈[+734,+1284]mm · 13 of 122 slices shown, 15 images]
[im 6/122  soft-tissue]
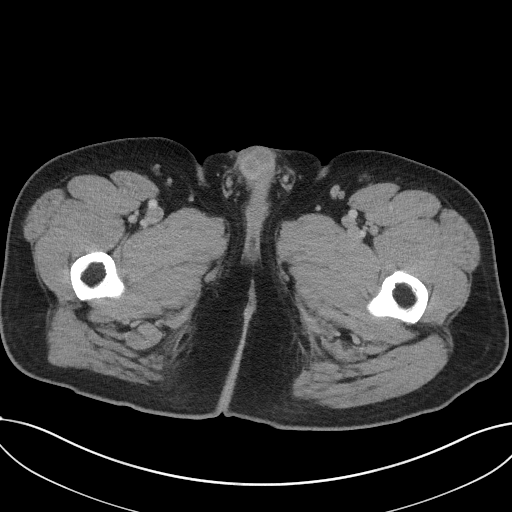
[im 6/122  bone]
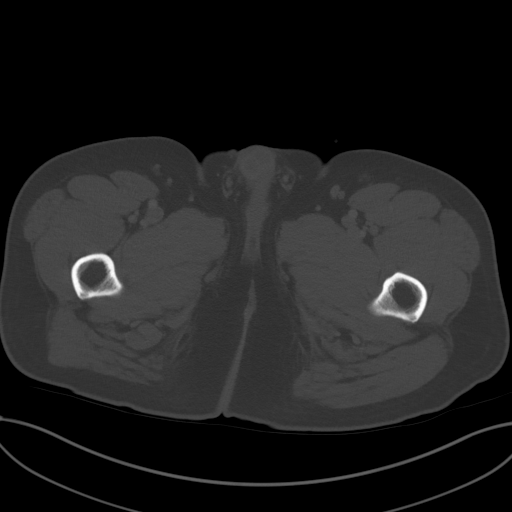
[im 16/122  soft-tissue]
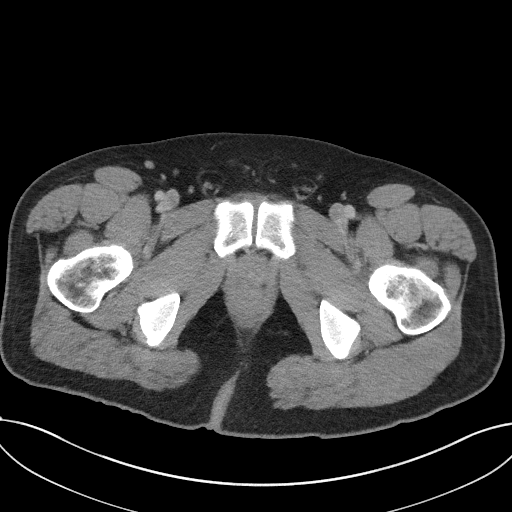
[im 27/122  soft-tissue]
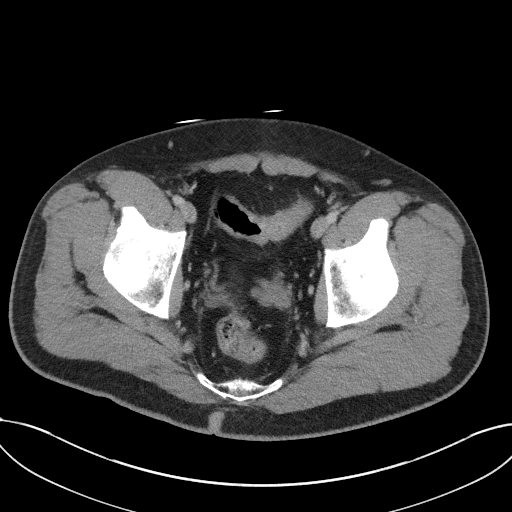
[im 32/122  soft-tissue]
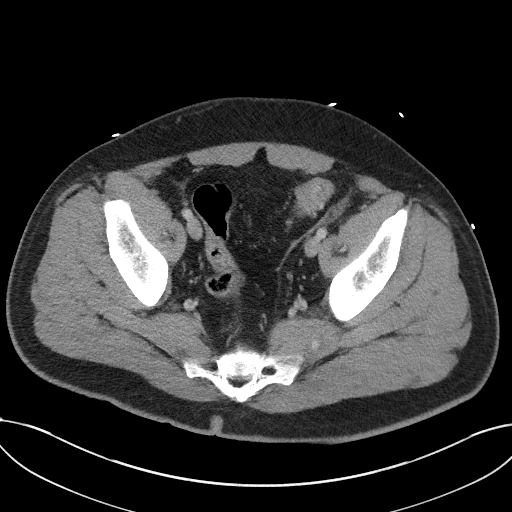
[im 43/122  soft-tissue]
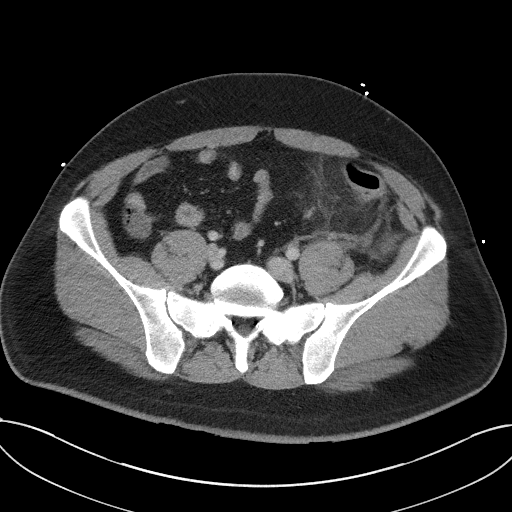
[im 53/122  soft-tissue]
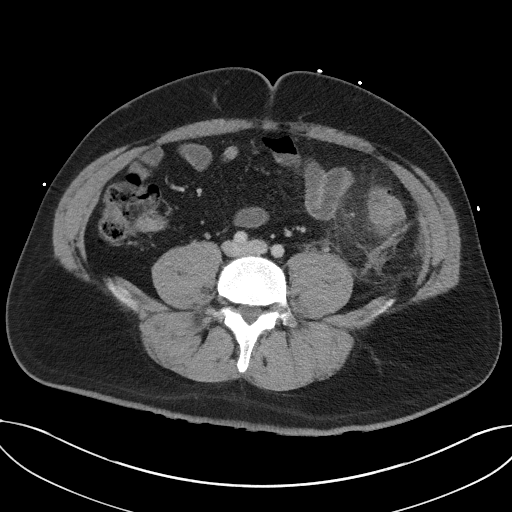
[im 64/122  soft-tissue]
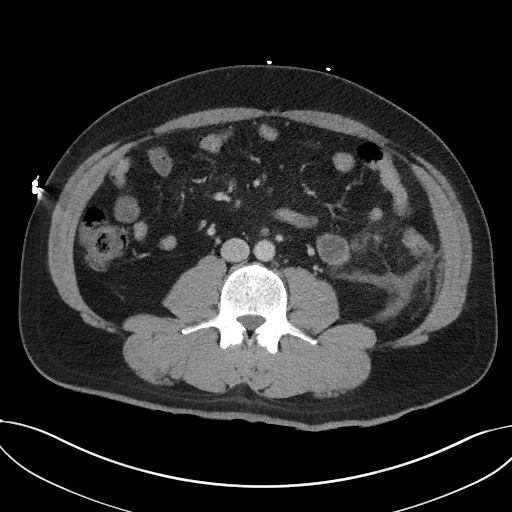
[im 69/122  soft-tissue]
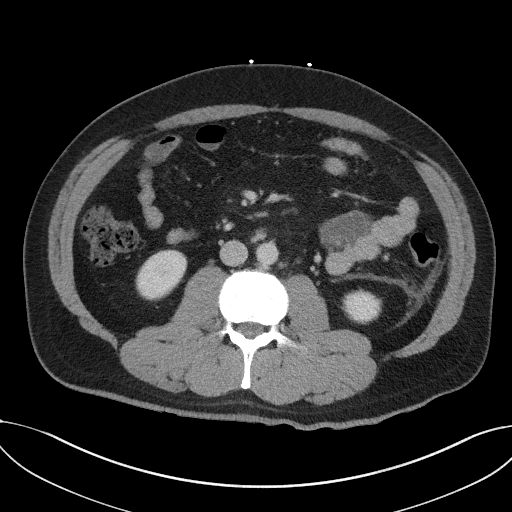
[im 79/122  soft-tissue]
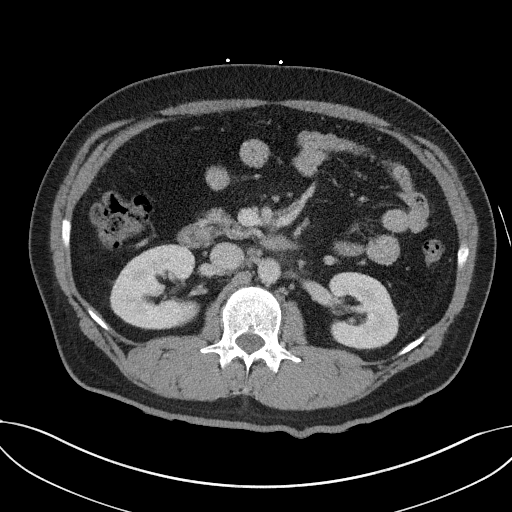
[im 79/122  bone]
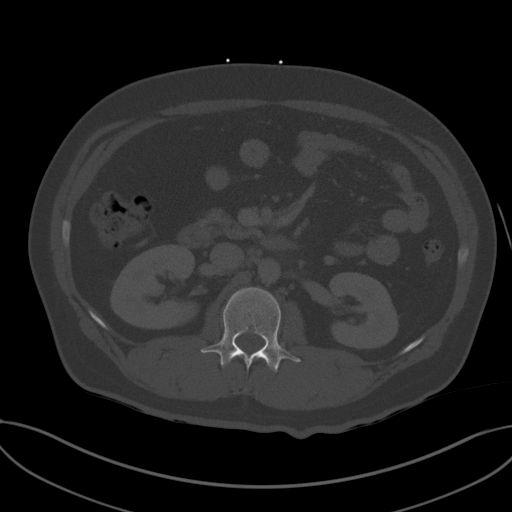
[im 90/122  soft-tissue]
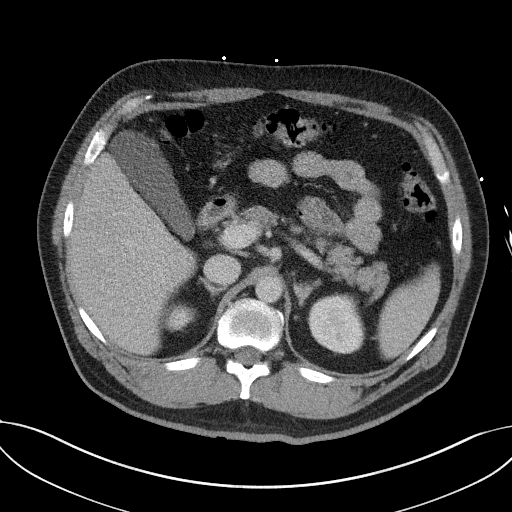
[im 95/122  soft-tissue]
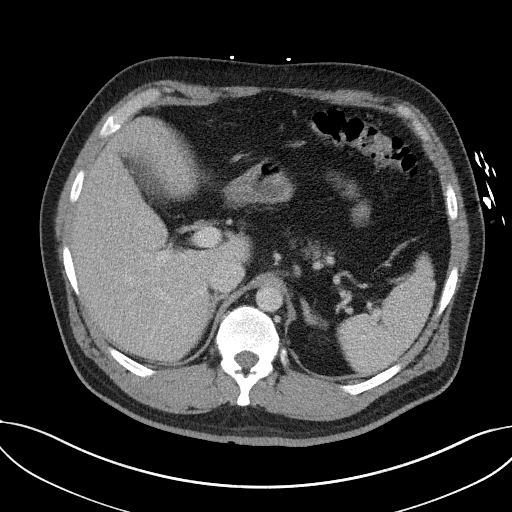
[im 106/122  soft-tissue]
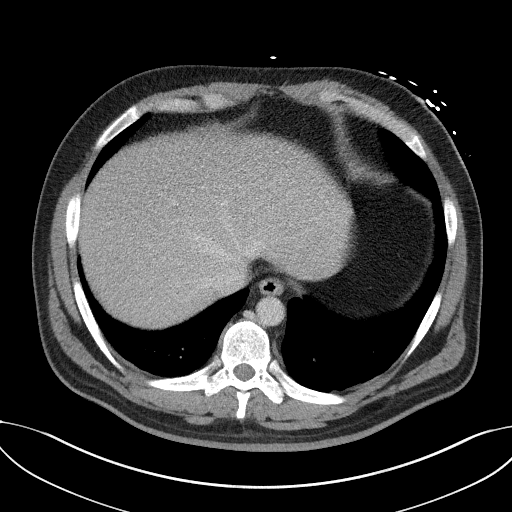
[im 116/122  soft-tissue]
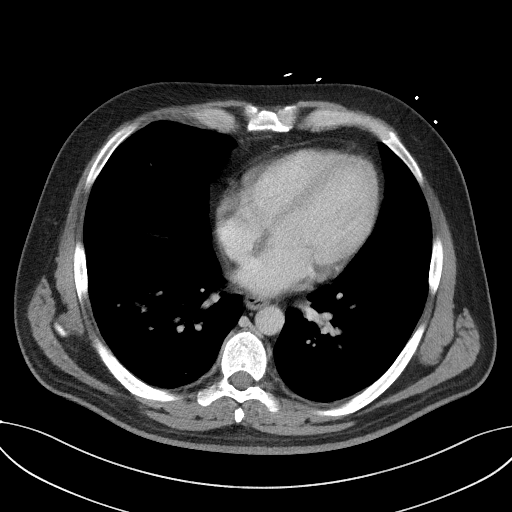

[Series 5: coronal st · coronal · 1.06mm/px · 3 of 117 slices shown]
[im 39/117  soft-tissue]
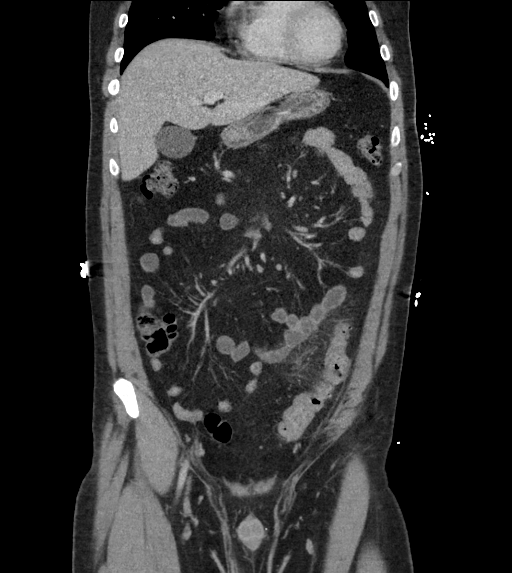
[im 52/117  soft-tissue]
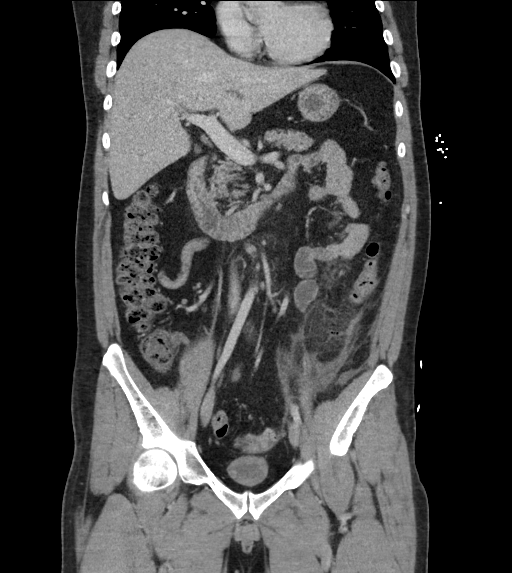
[im 65/117  soft-tissue]
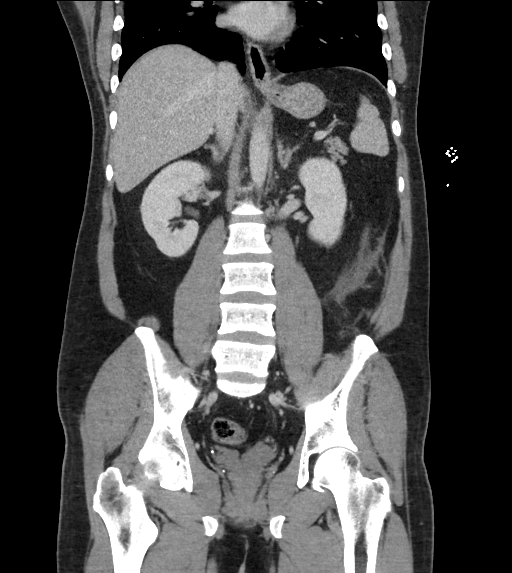

[16 of 46 positions shown; findings below may reference images not displayed]

RADIATION DOSE REDUCTION: This exam was performed according to the
departmental dose-optimization program which includes automated
exposure control, adjustment of the mA and/or kV according to
patient size and/or use of iterative reconstruction technique.

CONTRAST:  100mL OMNIPAQUE IOHEXOL 300 MG/ML  SOLN
FINDINGS: Lower chest: No acute abnormality.

Hepatobiliary: No focal liver abnormality is seen. No gallstones,
gallbladder wall thickening, or biliary dilatation.

Pancreas: Unremarkable. No pancreatic ductal dilatation or
surrounding inflammatory changes.

Spleen: Normal in size without focal abnormality.

Adrenals/Urinary Tract: Unremarkable adrenal glands. Kidneys enhance
symmetrically without focal lesion, stone, or hydronephrosis.
Ureters are nondilated. Urinary bladder is decompressed, limiting
its evaluation.

Stomach/Bowel: Stomach within normal limits. No dilated loops of
bowel. Focally thickened, inflamed segment of distal descending
colon containing inflamed appearing diverticula. Multiple foci of
extraluminal air adjacent to the segment compatible with a contained
micro perforation (series 2, image 72). Extensive surrounding
pericolonic fat stranding and a small amount of fluid within the
left pericolic gutter. No well-defined or rim enhancing fluid
collection is present at this time. Mild wall thickening within the
mid sigmoid colon at site of previously seen inflammation without
associated inflammatory changes, likely related to chronic
diverticulitis. No additional sites of focal bowel wall thickening.

Vascular/Lymphatic: No significant vascular findings are present. No
enlarged abdominal or pelvic lymph nodes.

Reproductive: Prostate is unremarkable.

Other: No significant volume of pneumoperitoneum within the abdomen.
No abdominal wall hernia.

Musculoskeletal: No acute or significant osseous findings.
IMPRESSION: 1. Acute diverticulitis of the distal descending colon with evidence
of a contained micro perforation. Adjacent ill-defined free fluid,
however no well-defined or rim-enhancing fluid collection is present
at this time.
2. Mild wall thickening within the mid sigmoid colon at site of
previously seen inflammation without associated inflammatory
changes, likely related to chronic diverticulitis.

These results were called by telephone at the time of interpretation
acknowledged these results.

## 2024-01-13 ENCOUNTER — Other Ambulatory Visit: Payer: Self-pay

## 2024-01-13 ENCOUNTER — Emergency Department (HOSPITAL_COMMUNITY)
Admission: EM | Admit: 2024-01-13 | Discharge: 2024-01-13 | Disposition: A | Discharge status: Home / Self Care | Attending: Emergency Medicine | Admitting: Emergency Medicine

## 2024-01-13 ENCOUNTER — Emergency Department (HOSPITAL_COMMUNITY)

## 2024-01-13 ENCOUNTER — Encounter (HOSPITAL_COMMUNITY): Payer: Self-pay

## 2024-01-13 DIAGNOSIS — K649 Unspecified hemorrhoids: Secondary | ICD-10-CM | POA: Diagnosis not present

## 2024-01-13 DIAGNOSIS — R7989 Other specified abnormal findings of blood chemistry: Secondary | ICD-10-CM

## 2024-01-13 DIAGNOSIS — K921 Melena: Secondary | ICD-10-CM | POA: Insufficient documentation

## 2024-01-13 DIAGNOSIS — Z91010 Allergy to peanuts: Secondary | ICD-10-CM | POA: Diagnosis not present

## 2024-01-13 DIAGNOSIS — R1032 Left lower quadrant pain: Secondary | ICD-10-CM | POA: Insufficient documentation

## 2024-01-13 DIAGNOSIS — Z79899 Other long term (current) drug therapy: Secondary | ICD-10-CM | POA: Diagnosis not present

## 2024-01-13 DIAGNOSIS — K5792 Diverticulitis of intestine, part unspecified, without perforation or abscess without bleeding: Secondary | ICD-10-CM

## 2024-01-13 HISTORY — DX: Diverticulitis of intestine, part unspecified, without perforation or abscess without bleeding: K57.92

## 2024-01-13 LAB — CBC WITH DIFFERENTIAL/PLATELET
Basophils Absolute: 0 K/uL (ref 0.0–0.1)
Basophils Relative: 0 %
Eosinophils Absolute: 0.1 K/uL (ref 0.0–0.5)
Eosinophils Relative: 1 %
HCT: 47.8 % (ref 39.0–52.0)
Hemoglobin: 16.4 g/dL (ref 13.0–17.0)
Lymphocytes Relative: 37 %
Lymphs Abs: 3.5 K/uL (ref 0.7–4.0)
MCH: 32.3 pg (ref 26.0–34.0)
MCHC: 34.3 g/dL (ref 30.0–36.0)
MCV: 94.3 fL (ref 80.0–100.0)
Monocytes Absolute: 1.4 K/uL — ABNORMAL HIGH (ref 0.1–1.0)
Monocytes Relative: 15 %
Neutro Abs: 4.4 K/uL (ref 1.7–7.7)
Neutrophils Relative %: 47 %
Platelets: ADEQUATE K/uL (ref 150–400)
RBC: 5.07 MIL/uL (ref 4.22–5.81)
RDW: 13.3 % (ref 11.5–15.5)
Smear Review: NORMAL
WBC: 9.4 K/uL (ref 4.0–10.5)
nRBC: 0 % (ref 0.0–0.2)

## 2024-01-13 LAB — COMPREHENSIVE METABOLIC PANEL WITH GFR
ALT: 21 U/L (ref 0–44)
AST: 33 U/L (ref 15–41)
Albumin: 4.2 g/dL (ref 3.5–5.0)
Alkaline Phosphatase: 80 U/L (ref 38–126)
Anion gap: 15 (ref 5–15)
BUN: 8 mg/dL (ref 6–20)
CO2: 24 mmol/L (ref 22–32)
Calcium: 9.3 mg/dL (ref 8.9–10.3)
Chloride: 100 mmol/L (ref 98–111)
Creatinine, Ser: 0.82 mg/dL (ref 0.61–1.24)
GFR, Estimated: >60 mL/min (ref >60)
Glucose, Bld: 87 mg/dL (ref 70–99)
Potassium: 4.6 mmol/L (ref 3.5–5.1)
Sodium: 138 mmol/L (ref 135–145)
Total Bilirubin: 0.5 mg/dL (ref 0.0–1.2)
Total Protein: 7.7 g/dL (ref 6.5–8.1)

## 2024-01-13 LAB — URINALYSIS, ROUTINE W REFLEX MICROSCOPIC
Bilirubin Urine: NEGATIVE
Glucose, UA: NEGATIVE mg/dL
Hgb urine dipstick: NEGATIVE
Ketones, ur: NEGATIVE mg/dL
Leukocytes,Ua: NEGATIVE
Nitrite: NEGATIVE
Protein, ur: NEGATIVE mg/dL
Specific Gravity, Urine: 1.004 — ABNORMAL LOW (ref 1.005–1.030)
pH: 6 (ref 5.0–8.0)

## 2024-01-13 LAB — LIPASE, BLOOD: Lipase: 25 U/L (ref 11–51)

## 2024-01-13 MED ORDER — HYDROCODONE-ACETAMINOPHEN 5-325 MG PO TABS: 1.0000 | ORAL_TABLET | Freq: Four times a day (QID) | ORAL | 0 refills | Status: AC | PRN

## 2024-01-13 MED ORDER — AMOXICILLIN-POT CLAVULANATE 875-125 MG PO TABS: 1.0000 | ORAL_TABLET | Freq: Two times a day (BID) | ORAL | 0 refills | Status: AC

## 2024-01-13 MED ORDER — AMOXICILLIN-POT CLAVULANATE 875-125 MG PO TABS
1.0000 | ORAL_TABLET | Freq: Once | ORAL | Status: AC
  Administered 2024-01-13: 1 via ORAL
  Filled 2024-01-13: qty 1

## 2024-01-13 MED ORDER — IOHEXOL 300 MG/ML  SOLN
100.0000 mL | Freq: Once | INTRAMUSCULAR | Status: AC | PRN
  Administered 2024-01-13: 100 mL via INTRAVENOUS

## 2024-01-13 MED ORDER — FENTANYL CITRATE (PF) 100 MCG/2ML IJ SOLN
50.0000 ug | Freq: Once | INTRAMUSCULAR | Status: AC
  Administered 2024-01-13: 50 ug via INTRAVENOUS
  Filled 2024-01-13: qty 2

## 2024-01-13 NOTE — ED Provider Notes (Signed)
  Physical Exam  BP (!) 118/91   Pulse 94   Temp 98.8 F (37.1 C) (Oral)   Resp 17   Ht 6' 3 (1.905 m)   Wt 99.8 kg   SpO2 98%   BMI 27.50 kg/m   Physical Exam HENT:     Head: Normocephalic.  Eyes:     Extraocular Movements: Extraocular movements intact.     Pupils: Pupils are equal, round, and reactive to light.  Skin:    General: Skin is warm and dry.  Neurological:     General: No focal deficit present.     Mental Status: He is alert.     Procedures  Procedures  ED Course / MDM    Medical Decision Making Amount and/or Complexity of Data Reviewed Labs: ordered. Radiology: ordered.  Risk Prescription drug management.   This patient's care was assumed by me at shift change from [name]. The tests pending are CT abd/pelvis.  Patient here with lower quadrant abdominal pain for 3 days with history of diverticulitis, labs are reassuring, patient is afebrile, pain controlled with fentanyl .  Treatment to be determined by CT.  Patient was re-evaluated by me as well. I discussed their result with them and plan is for discharge home with Augmentin  for uncomplicated diverticulitis found on CT.  Patient is agreeable with this, wants to be discharged and can go home and eat.  Advised on GI follow-up.  I reviewed his labs and CBC showed no leukocytosis but he did have reactive benign lymphocytes and smudge cells and stomatocytes.  Discussed with patient unsure of the significance of this in the setting of diverticulitis, could be due to infection that could also indicate CLL so he will need to follow-up for repeat CBC with differential, and if still present will need to follow-up with hematology.  Patient and his father in the room are agreeable with plan of care and discharge.       Suellen Sherran DELENA DEVONNA 01/13/24 1613    Yolande Lamar BROCKS, MD 01/18/24 [phone]

## 2024-01-13 NOTE — ED Provider Notes (Signed)
 [place] EMERGENCY DEPARTMENT AT [facility] Provider Note   CSN: [id number] Arrival date & time: 01/13/24  1015     Patient presents with: Abdominal Pain   [name] is a 38 y.o. male.   Patient is here for evaluation of left lower quadrant pain and blood in stool x 3 days.  Patient has a history of diverticulosis and diverticulitis with microperforation.  Patient reports bright red blood in stool for the last 3 days.  Initially it was less pronounced, however yesterday evening he states blood-filled the toilet bowl after having a straining bowel movement.  He denies recent constipation or diarrhea.  He denies recent fevers or illness.  He denies dizziness, fatigue, weakness, shortness of breath, chest pain, loss of consciousness, or falls.  He does have hemorrhoids.  He does not typically have blood on toilet paper.  He denies any current rectal pain.  He typically has a bowel movement every other day.  He has been eating less since the abdominal pain began.  His father adds that the patient does not typically eat very much at baseline.  Most recent colonoscopy performed in March 2023 that showed diverticulosis without any additional findings. In May 2023, the patient had a microperforation of the diverticulum. General surgery was consulted and recommended conservative management at that time.     The history is provided by the patient and a parent.  Abdominal Pain Pain location:  LLQ Pain quality: sharp   Pain radiates to:  Does not radiate Pain severity:  Mild Duration:  3 days Timing:  Constant Context: not diet changes, not sick contacts, not suspicious food intake and not trauma   Associated symptoms: hematochezia   Associated symptoms: no chest pain, no constipation, no cough, no diarrhea, no fatigue, no fever, no hematemesis, no hematuria, no melena, no nausea, no shortness of breath and no vomiting   Risk factors: has not had multiple surgeries, not  pregnant and no recent hospitalization        Prior to Admission medications   Medication Sig Start Date End Date Taking? Authorizing Provider  acetaminophen  (TYLENOL ) 500 MG tablet Take 1,000 mg by mouth every 6 (six) hours as needed for mild pain or fever.    [provider]  atorvastatin  (LIPITOR) 10 MG tablet Take 10 mg by mouth at bedtime. 06/01/21   [provider]  clonazePAM  (KLONOPIN ) 0.5 MG tablet Take 0.5 mg by mouth at bedtime. 03/30/21   [provider]  divalproex  (DEPAKOTE ) 500 MG DR tablet Take 500 mg by mouth at bedtime. 03/27/21   [provider]  haloperidol  (HALDOL ) 10 MG tablet Take 15 mg by mouth daily. 03/15/21   [provider]  hydrOXYzine  (ATARAX ) 25 MG tablet Take 10-25 mg by mouth at bedtime as needed for anxiety. 10mg  during the day as needed, 25mg  at night as needed 07/29/21   [provider]  ondansetron  (ZOFRAN -ODT) 4 MG disintegrating tablet Take 1 tablet (4 mg total) by mouth every 8 (eight) hours as needed for nausea or vomiting. 04/11/22   Yolande Lamar BROCKS, MD  oxyCODONE  (ROXICODONE ) 5 MG immediate release tablet Take 1 tablet (5 mg total) by mouth every 4 (four) hours as needed for severe pain. 04/11/22   Yolande Lamar BROCKS, MD    Allergies: Peanut-containing drug products    Review of Systems  Constitutional:  Negative for fatigue and fever.  Respiratory:  Negative for cough and shortness of breath.   Cardiovascular:  Negative  for chest pain.  Gastrointestinal:  Positive for abdominal pain and hematochezia. Negative for constipation, diarrhea, hematemesis, melena, nausea and vomiting.  Genitourinary:  Negative for hematuria.    Updated Vital Signs BP (!) 110/91 (BP Location: Right Arm)   Pulse 95   Temp 98.7 F (37.1 C) (Oral)   Resp 16   Ht 6' 3 (1.905 m)   Wt 99.8 kg   SpO2 98%   BMI 27.50 kg/m   Physical Exam  (all labs ordered are listed, but only abnormal results are displayed) Labs  Reviewed  URINALYSIS, ROUTINE W REFLEX MICROSCOPIC - Abnormal; Notable for the following components:      Result Value   Specific Gravity, Urine 1.004 (*)    All other components within normal limits  LIPASE, BLOOD  COMPREHENSIVE METABOLIC PANEL WITH GFR  CBC WITH DIFFERENTIAL/PLATELET    EKG: None  Radiology: No results found.   Procedures   Medications Ordered in the ED - No data to display    Patient presents to the ED for concern of left lower quadrant abdominal pain and blood in stool, this involves an extensive number of treatment options, and is a complaint that carries with it a high risk of complications and morbidity.  The differential diagnosis includes gastroenteritis, bowel perforation, bowel abscess, hemorrhoids, diverticulitis, malignancy.   Co morbidities that complicate the patient evaluation  History of diverticulosis and diverticulitis with microperforation. TBI   Additional history obtained:  Additional history obtained from  Outside Medical Records and Past Admission   External records from outside source obtained and reviewed including this admission and prior colonoscopy.   Lab Tests:  I Ordered, and personally interpreted labs.  The pertinent results include: Normal hemoglobin and no leukocytosis.   Imaging Studies ordered:  I ordered imaging studies including CT abdomen pelvis.  Imaging pending at time of signout   Medicines ordered and prescription drug management:  I ordered medication including fentanyl  for pain management. Reevaluation of the patient after these medicines showed that the patient improved I have reviewed the patients home medicines and have made adjustments as needed   Problem List / ED Course:  Patient is here for evaluation of left lower quadrant abdominal pain and blood in stool.  Patient given fentanyl  for pain management.  CT abdomen pelvis pending.   Reevaluation:  After the interventions noted above, I  reevaluated the patient and found that they have :stable   Dispostion:  Imaging pending at signout.                                   Medical Decision Making Amount and/or Complexity of Data Reviewed Labs: ordered. Radiology: ordered.  Risk Prescription drug management.        Final diagnoses:  None    ED Discharge Orders     None          Rosina Almarie DELENA DEVONNA 01/13/24 1546    Garrick Charleston, MD 01/14/24 [id number]

## 2024-01-13 NOTE — Discharge Instructions (Addendum)
 You are seen in the ER today for abdominal pain, your CT scan shows diverticulitis.  We are starting you on antibiotics, so gave you pain medication to use if ibuprofen  is not adequate at home.  Drink plenty of fluids and rest and follow-up with gastroenterology after your symptoms have resolved for colonoscopy.  Of note, your CBC showed abnormal cells on the smear called smudge cells.  Should have your CBC repeated after treatment is done to see if these are still there, if they are you will need to follow-up with hematology  (the blood specialist).

## 2024-01-13 NOTE — ED Notes (Addendum)
 Unable to obtain IV access. US  IV pending.

## 2024-01-13 NOTE — ED Triage Notes (Signed)
 Pt arrived via POV c/o LLQ abdominal pain X 3 days and reports Hx of diverticulitis and endorses seeing blood in his stool.

## 2024-01-30 ENCOUNTER — Other Ambulatory Visit: Payer: Self-pay

## 2024-01-30 ENCOUNTER — Emergency Department (HOSPITAL_COMMUNITY)
Admission: EM | Admit: 2024-01-30 | Discharge: 2024-01-30 | Disposition: A | Discharge status: Home / Self Care | Attending: Emergency Medicine | Admitting: Emergency Medicine

## 2024-01-30 DIAGNOSIS — Z91010 Allergy to peanuts: Secondary | ICD-10-CM | POA: Insufficient documentation

## 2024-01-30 DIAGNOSIS — M545 Low back pain, unspecified: Secondary | ICD-10-CM | POA: Diagnosis present

## 2024-01-30 MED ORDER — LIDOCAINE 5 % EX PTCH
1.0000 | MEDICATED_PATCH | CUTANEOUS | Status: DC
  Administered 2024-01-30: 1 via TRANSDERMAL
  Filled 2024-01-30: qty 1

## 2024-01-30 MED ORDER — METHOCARBAMOL 500 MG PO TABS: 500.0000 mg | ORAL_TABLET | Freq: Two times a day (BID) | ORAL | 0 refills | Status: AC

## 2024-01-30 MED ORDER — LIDOCAINE 4 % EX PTCH: 1.0000 | MEDICATED_PATCH | CUTANEOUS | 0 refills | Status: AC

## 2024-01-30 MED ORDER — KETOROLAC TROMETHAMINE 15 MG/ML IJ SOLN
15.0000 mg | Freq: Once | INTRAMUSCULAR | Status: AC
  Administered 2024-01-30: 15 mg via INTRAMUSCULAR
  Filled 2024-01-30: qty 1

## 2024-01-30 NOTE — ED Provider Notes (Signed)
 [place] EMERGENCY DEPARTMENT AT [facility] Provider Note   CSN: [id number] Arrival date & time: 01/30/24  9140     Patient presents with: Back Pain   [name] is a 38 y.o. male with history of bipolar, remote TBI, anxiety presents with complaints of atraumatic back pain x 4 days.  Pain is worse with movement.  No history of back injuries or surgeries.  Denies any radicular symptoms.  No urinary or fecal incontinence.  He still ambulatory with some discomfort.  No history of IV drug use or malignancy.  No history of kidney stones.  Denies any dysuria, increased frequency or hematuria.  No abdominal pain.    Back Pain     Past Medical History:  Diagnosis Date   Anxiety    Bipolar 1 disorder (HCC)    Brain injury (HCC)    Diverticulitis    2023   Past Surgical History:  Procedure Laterality Date   ANKLE SURGERY     COLONOSCOPY WITH PROPOFOL  N/A 06/04/2021   Procedure: COLONOSCOPY WITH PROPOFOL ;  Surgeon: Evonnie Dorothyann DELENA, DO;  Location: [facility];  Service: General;  Laterality: N/A;   FRACTURE SURGERY       Prior to Admission medications   Medication Sig Start Date End Date Taking? Authorizing Provider  lidocaine  (HM LIDOCAINE  PATCH) 4 % Place 1 patch onto the skin daily. 01/30/24  Yes Donnajean Lynwood DEL, PA-C  methocarbamol  (ROBAXIN ) 500 MG tablet Take 1 tablet (500 mg total) by mouth 2 (two) times daily. 01/30/24  Yes Donnajean Lynwood DEL, PA-C  acetaminophen  (TYLENOL ) 500 MG tablet Take 1,000 mg by mouth every 6 (six) hours as needed for mild pain or fever.    [provider]  amoxicillin -clavulanate (AUGMENTIN ) 875-125 MG tablet Take 1 tablet by mouth every 12 (twelve) hours. 01/13/24   Suellen Cantor A, PA-C  atorvastatin  (LIPITOR) 10 MG tablet Take 10 mg by mouth at bedtime. 06/01/21   [provider]  clonazePAM  (KLONOPIN ) 0.5 MG tablet Take 0.5 mg by mouth at bedtime. 03/30/21   [provider]  divalproex   (DEPAKOTE ) 500 MG DR tablet Take 500 mg by mouth at bedtime. 03/27/21   [provider]  haloperidol  (HALDOL ) 10 MG tablet Take 15 mg by mouth daily. 03/15/21   [provider]  HYDROcodone -acetaminophen  (NORCO/VICODIN) 5-325 MG tablet Take 1 tablet by mouth every 6 (six) hours as needed. 01/13/24   Suellen Cantor A, PA-C  hydrOXYzine  (ATARAX ) 25 MG tablet Take 10-25 mg by mouth at bedtime as needed for anxiety. 10mg  during the day as needed, 25mg  at night as needed 07/29/21   [provider]  ondansetron  (ZOFRAN -ODT) 4 MG disintegrating tablet Take 1 tablet (4 mg total) by mouth every 8 (eight) hours as needed for nausea or vomiting. 04/11/22   Yolande Lamar BROCKS, MD    Allergies: Peanut-containing drug products    Review of Systems  Musculoskeletal:  Positive for back pain.    Updated Vital Signs BP 119/84 (BP Location: Left Arm)   Resp 19   SpO2 99%   Physical Exam Vitals and nursing note reviewed.  Constitutional:      General: He is not in acute distress.    Appearance: He is well-developed.  HENT:     Head: Normocephalic and atraumatic.  Eyes:     Conjunctiva/sclera: Conjunctivae normal.  Cardiovascular:     Rate and Rhythm: Normal rate and regular rhythm.     Heart sounds: No murmur heard.  Pulmonary:     Effort: Pulmonary effort is normal. No respiratory distress.     Breath sounds: Normal breath sounds.  Abdominal:     Palpations: Abdomen is soft.     Tenderness: There is no abdominal tenderness. There is no right CVA tenderness or left CVA tenderness.  Musculoskeletal:        General: No swelling.     Cervical back: Neck supple.     Comments: Left lumbar paraspinal tenderness without midline tenderness, 5 out of 5 strength, ambulatory without difficulty, discomfort with straight leg raise  Skin:    General: Skin is warm and dry.     Capillary Refill: Capillary refill takes less than 2 seconds.  Neurological:     Mental Status: He is alert.   Psychiatric:        Mood and Affect: Mood normal.     (all labs ordered are listed, but only abnormal results are displayed) Labs Reviewed - No data to display  EKG: None  Radiology: No results found.   Procedures   Medications Ordered in the ED  ketorolac  (TORADOL ) 15 MG/ML injection 15 mg (has no administration in time range)  lidocaine  (LIDODERM ) 5 % 1 patch (has no administration in time range)    Clinical Course as of 01/30/24 1027  Sat Jan 30, 2024  0947 Patient evaluated for atraumatic back pain x 4 days without any radicular symptoms or red flag symptoms.  Upon arrival he is hemodynamically stable.  On exam he has tenderness to the left lumbar paraspinal region.  No midline spinal tenderness.  He has 5 out of 5 strength and is ambulatory without difficulty.  No CVAT.  No abdominal tenderness.  Overall most consistent with musculoskeletal etiology such as muscle strain  Do not feel that any advanced imaging or urgent neurosurgery consult is indicated at this time.  Will provide a shot of Toradol  and lidocaine  patch here in the ED and discharged with muscle relaxant, lidocaine  patches and Ortho follow-up.  Strict return precautions provided.  Mom is at bedside.  They are both understanding and in agreement with plan. [JT]    Clinical Course User Index [JT] Donnajean Lynwood DEL, PA-C                                 Medical Decision Making  This patient presents to the ED with chief complaint(s) of Back pain.  The complaint involves an extensive differential diagnosis and also carries with it a high risk of complications and morbidity.   Pertinent past medical history as listed in HPI  The differential diagnosis includes  Based off exam and history have a lower suspicion for cauda equina, spinal abscess, fracture.  Low suspicion for nephrolithiasis or pyelonephritis.  Additional history obtained: Additional history obtained from family Records reviewed Care  Everywhere/External Records  Disposition:   Patient will be discharged home. The patient has been appropriately medically screened and/or stabilized in the ED. I have low suspicion for any other emergent medical condition which would require further screening, evaluation or treatment in the ED or require inpatient management. At time of discharge the patient is hemodynamically stable and in no acute distress. I have discussed work-up results and diagnosis with patient and answered all questions. Patient is agreeable with discharge plan. We discussed strict return precautions for returning to the emergency department and they verbalized understanding.     Social Determinants of Health:  none  This note was dictated with voice recognition software.  Despite best efforts at proofreading, errors may have occurred which can change the documentation meaning.       Final diagnoses:  Acute left-sided low back pain without sciatica    ED Discharge Orders          Ordered    methocarbamol  (ROBAXIN ) 500 MG tablet  2 times daily        01/30/24 0952    lidocaine  (HM LIDOCAINE  PATCH) 4 %  Every 24 hours        01/30/24 0952               Donnajean Lynwood DEL, PA-C 01/30/24 1050    Cleotilde Rogue, MD 02/01/24 [phone]

## 2024-01-30 NOTE — ED Triage Notes (Signed)
 Pt c/o L lower back pain 10/10, states it is stabbing and sharp in nature and feels like spasms too denies any kidney issues, states he took a percocet last night for the pain and did have some relief

## 2024-01-30 NOTE — Discharge Instructions (Addendum)
 You were evaluated in the emergency room for back pain.  Your exam was most consistent with a muscle strain.  Prescription for Robaxin , muscle relaxer was sent into your pharmacy in addition to the lidocaine  patches.  Please avoid driving and drinking alcohol while using the muscle relaxer as it may cause drowsiness.  You may additionally alternate Tylenol  and ibuprofen  throughout the day.  Please follow-up with the orthopedic doctor should your symptoms persist.  If you experience any new or worsening symptoms including abdominal pain, difficulty or pain with urinating please return to the emergency room.
